# Patient Record
Sex: Female | Born: 1980 | Race: White | Hispanic: No | Marital: Single | Smoking: Current every day smoker
Health system: Southern US, Community
[De-identification: ages and names within clinical notes are randomized; demographics above are authoritative.]

## PROBLEM LIST (undated history)

## (undated) DIAGNOSIS — F419 Anxiety disorder, unspecified: Secondary | ICD-10-CM

## (undated) DIAGNOSIS — G43909 Migraine, unspecified, not intractable, without status migrainosus: Secondary | ICD-10-CM

## (undated) DIAGNOSIS — N809 Endometriosis, unspecified: Secondary | ICD-10-CM

## (undated) DIAGNOSIS — F329 Major depressive disorder, single episode, unspecified: Secondary | ICD-10-CM

## (undated) DIAGNOSIS — F4310 Post-traumatic stress disorder, unspecified: Secondary | ICD-10-CM

## (undated) DIAGNOSIS — F32A Depression, unspecified: Secondary | ICD-10-CM

## (undated) HISTORY — PX: CHOLECYSTECTOMY: SHX55

## (undated) HISTORY — PX: LEEP: SHX91

## (undated) HISTORY — PX: TUBAL LIGATION: SHX77

---

## 2004-01-28 ENCOUNTER — Emergency Department: Payer: Self-pay | Admitting: Emergency Medicine

## 2004-03-10 ENCOUNTER — Emergency Department: Payer: Self-pay | Admitting: Emergency Medicine

## 2004-05-18 ENCOUNTER — Emergency Department: Payer: Self-pay | Admitting: Emergency Medicine

## 2004-06-16 ENCOUNTER — Inpatient Hospital Stay: Payer: Self-pay | Admitting: Surgery

## 2004-08-15 ENCOUNTER — Emergency Department: Payer: Self-pay | Admitting: General Practice

## 2004-11-17 ENCOUNTER — Emergency Department: Payer: Self-pay | Admitting: Emergency Medicine

## 2004-11-17 ENCOUNTER — Other Ambulatory Visit: Payer: Self-pay

## 2005-01-22 ENCOUNTER — Emergency Department: Payer: Self-pay | Admitting: Emergency Medicine

## 2005-03-16 ENCOUNTER — Ambulatory Visit: Payer: Self-pay | Admitting: Unknown Physician Specialty

## 2005-05-06 ENCOUNTER — Emergency Department: Payer: Self-pay | Admitting: Emergency Medicine

## 2005-07-04 ENCOUNTER — Emergency Department: Payer: Self-pay | Admitting: Emergency Medicine

## 2005-10-14 ENCOUNTER — Emergency Department: Payer: Self-pay | Admitting: Emergency Medicine

## 2005-12-06 ENCOUNTER — Emergency Department: Payer: Self-pay | Admitting: Emergency Medicine

## 2006-01-08 ENCOUNTER — Emergency Department: Payer: Self-pay | Admitting: Emergency Medicine

## 2006-05-25 ENCOUNTER — Emergency Department: Payer: Self-pay | Admitting: Emergency Medicine

## 2006-05-26 ENCOUNTER — Ambulatory Visit: Payer: Self-pay | Admitting: Emergency Medicine

## 2006-05-27 ENCOUNTER — Ambulatory Visit: Payer: Self-pay

## 2006-06-05 ENCOUNTER — Emergency Department: Payer: Self-pay | Admitting: Internal Medicine

## 2006-09-06 ENCOUNTER — Ambulatory Visit: Payer: Self-pay

## 2007-02-07 ENCOUNTER — Emergency Department: Payer: Self-pay | Admitting: Emergency Medicine

## 2007-02-07 ENCOUNTER — Other Ambulatory Visit: Payer: Self-pay

## 2007-03-11 ENCOUNTER — Emergency Department: Payer: Self-pay | Admitting: Emergency Medicine

## 2007-04-01 ENCOUNTER — Emergency Department: Payer: Self-pay | Admitting: Emergency Medicine

## 2007-09-03 ENCOUNTER — Observation Stay: Payer: Self-pay | Admitting: Obstetrics and Gynecology

## 2007-09-04 ENCOUNTER — Ambulatory Visit: Payer: Self-pay | Admitting: Obstetrics and Gynecology

## 2007-10-02 ENCOUNTER — Observation Stay: Payer: Self-pay | Admitting: Certified Nurse Midwife

## 2007-11-01 ENCOUNTER — Inpatient Hospital Stay: Payer: Self-pay | Admitting: Obstetrics and Gynecology

## 2007-11-09 ENCOUNTER — Emergency Department: Payer: Self-pay | Admitting: Emergency Medicine

## 2008-02-19 ENCOUNTER — Emergency Department: Payer: Self-pay | Admitting: Emergency Medicine

## 2008-02-25 ENCOUNTER — Emergency Department: Payer: Self-pay | Admitting: Emergency Medicine

## 2008-03-15 ENCOUNTER — Emergency Department (HOSPITAL_COMMUNITY): Admission: EM | Admit: 2008-03-15 | Discharge: 2008-03-16 | Payer: Self-pay | Admitting: Emergency Medicine

## 2008-03-15 ENCOUNTER — Emergency Department: Payer: Self-pay | Admitting: Emergency Medicine

## 2008-04-18 ENCOUNTER — Inpatient Hospital Stay: Payer: Self-pay | Admitting: Unknown Physician Specialty

## 2008-08-07 ENCOUNTER — Emergency Department: Payer: Self-pay | Admitting: Internal Medicine

## 2008-11-26 ENCOUNTER — Emergency Department: Payer: Self-pay | Admitting: Emergency Medicine

## 2008-12-03 ENCOUNTER — Emergency Department: Payer: Self-pay | Admitting: Unknown Physician Specialty

## 2009-05-02 IMAGING — US UNKNOWN
1 series · 14 of 16 positions shown · non-contrast
Comparison: None

CLINICAL DATA: Lower abdominal pain, vaginal discharge.

TRANSABDOMINAL AND TRANSVAGINAL ULTRASOUND OF PELVIS
TECHNIQUE: Both transabdominal and transvaginal ultrasound
examinations of the pelvis were performed including evaluation of
the uterus, ovaries, adnexal regions, and pelvic cul-de-sac.

[Series 1: unknown · 0.32mm/px · 14 of 37 slices shown]
[im 1/37]
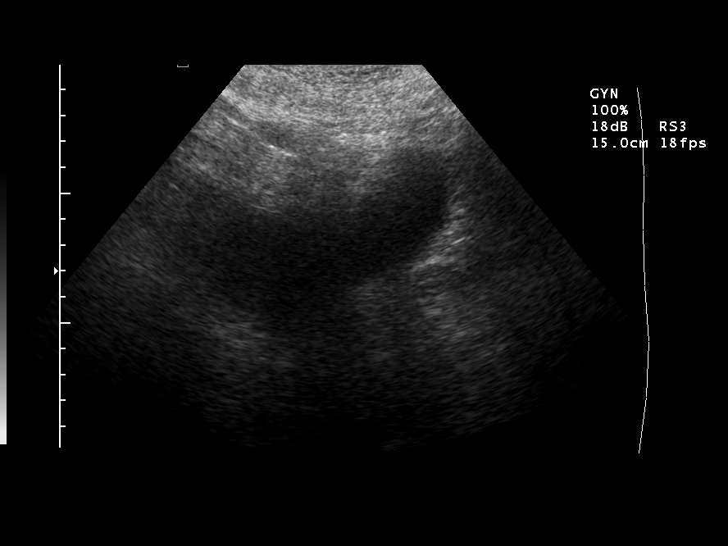
[im 3/37]
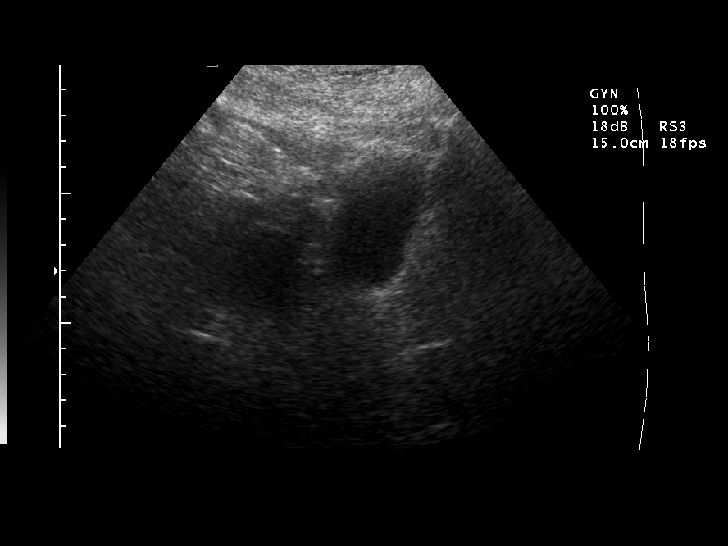
[im 5/37]
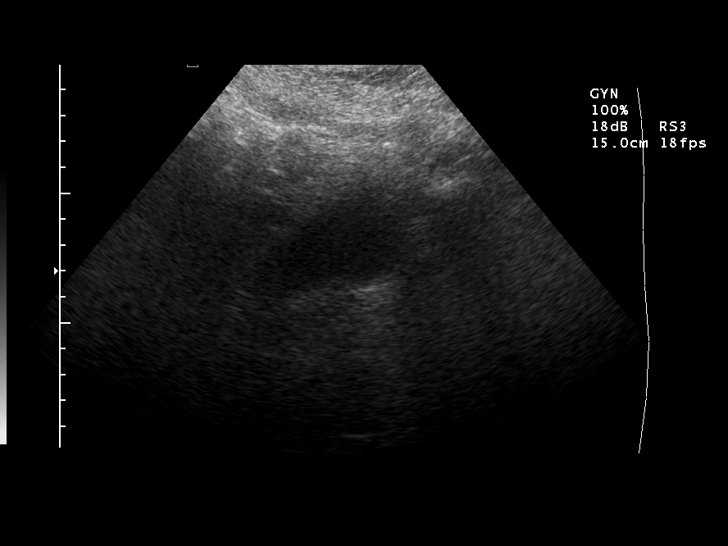
[im 10/37]
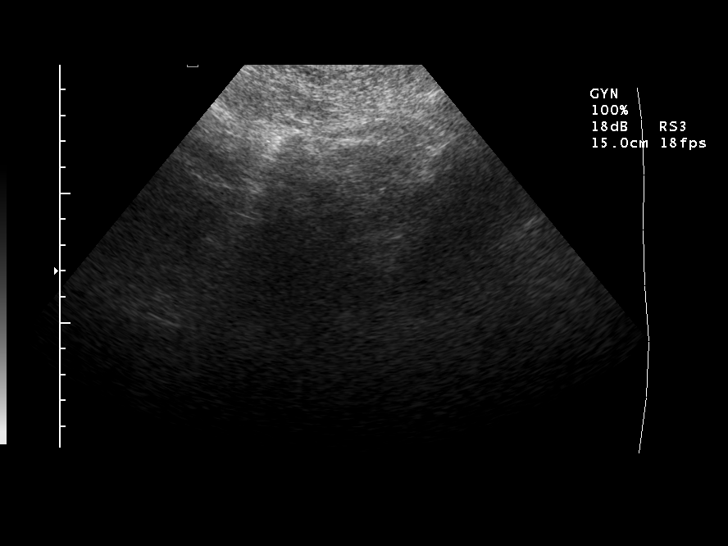
[im 13/37]
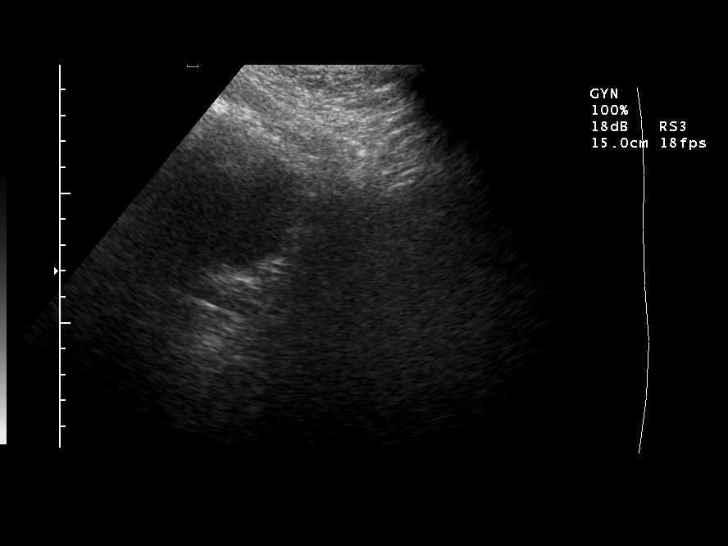
[im 15/37]
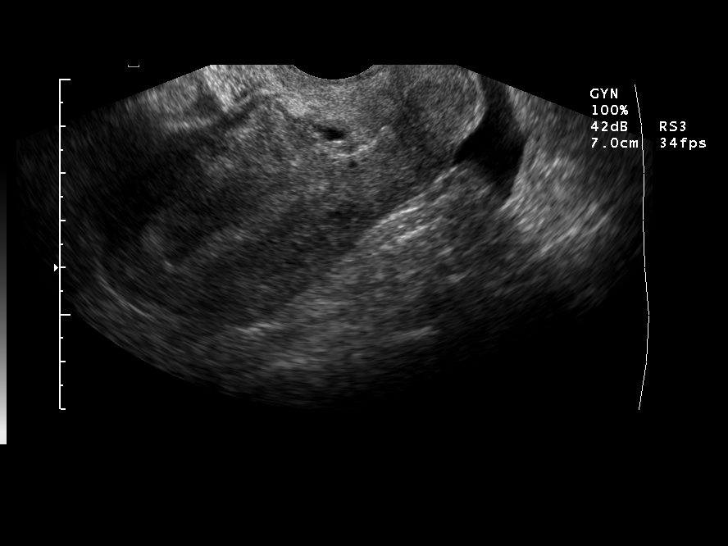
[im 17/37]
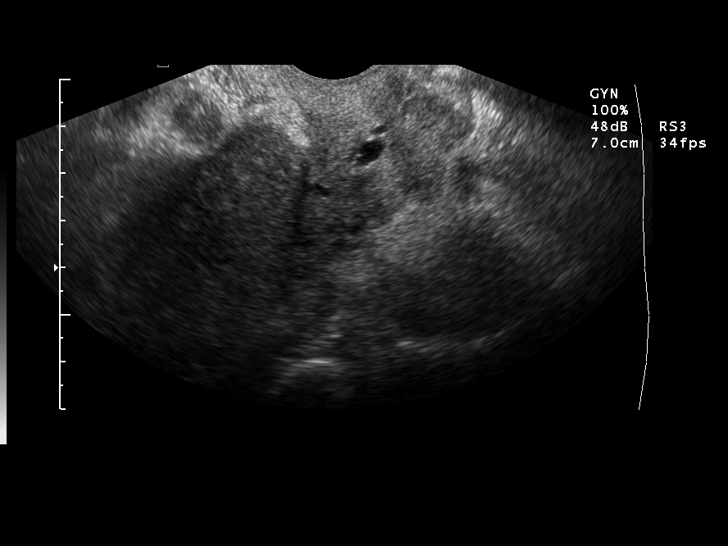
[im 20/37]
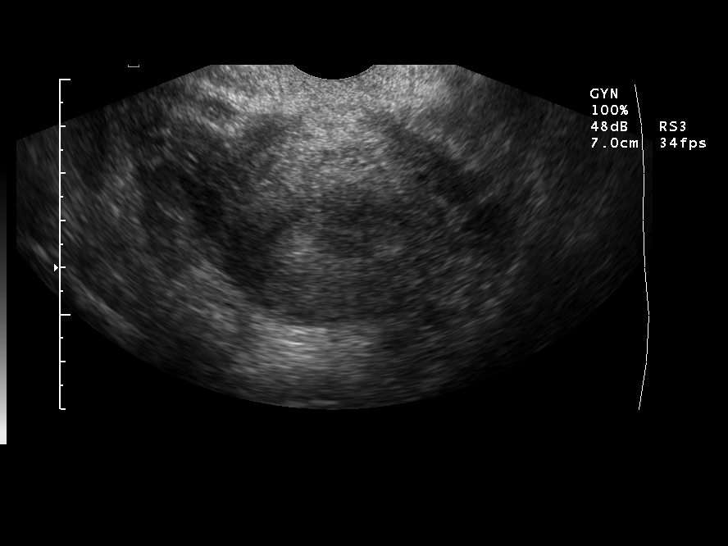
[im 22/37]
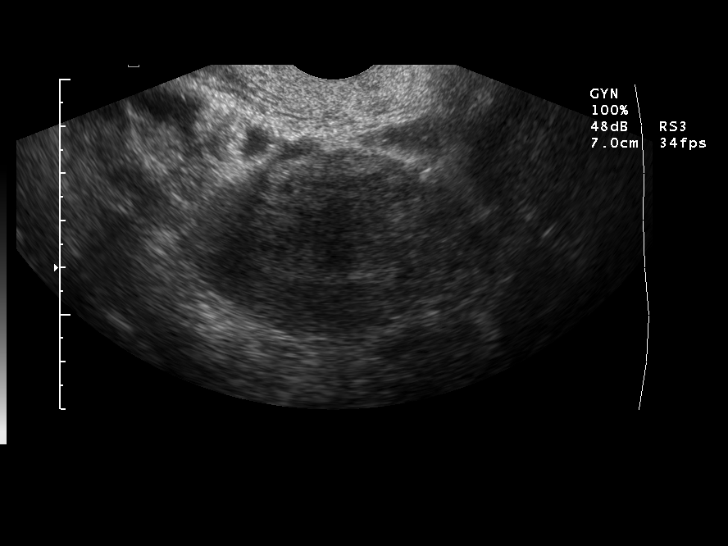
[im 25/37]
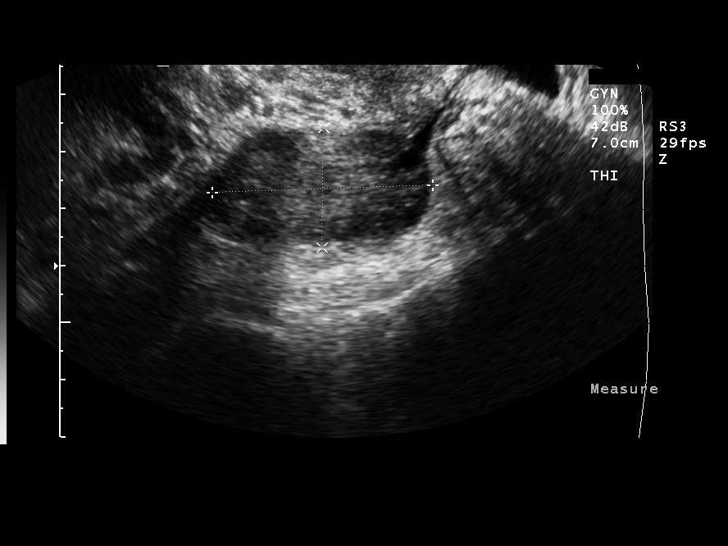
[im 29/37]
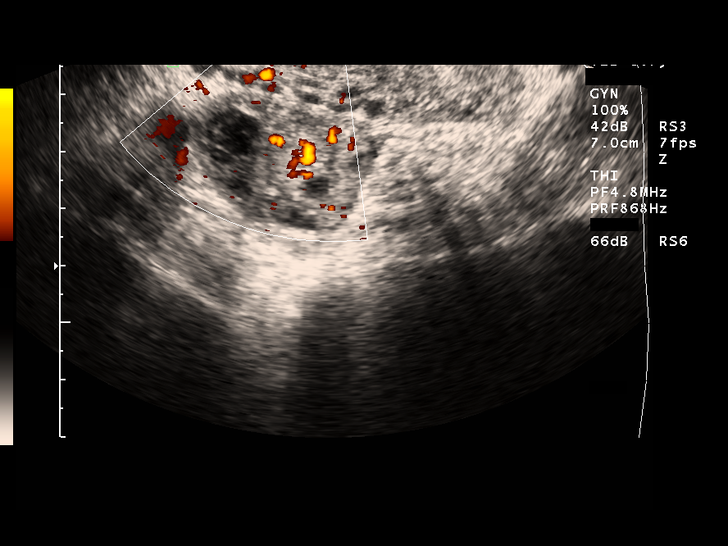
[im 32/37]
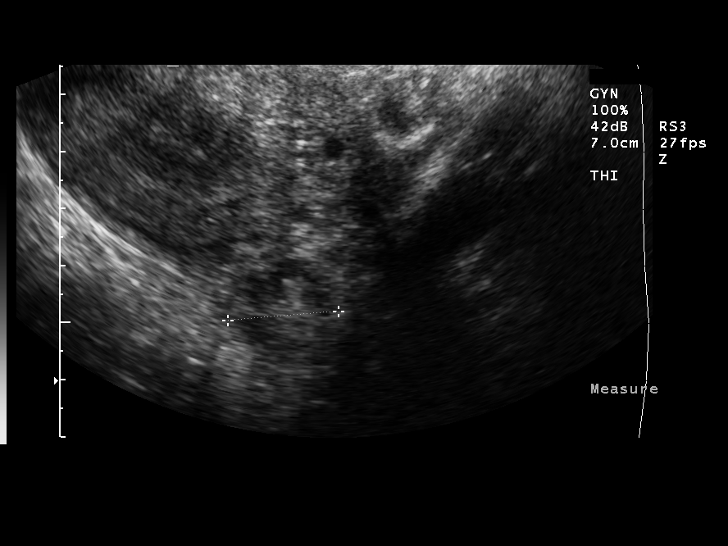
[im 34/37]
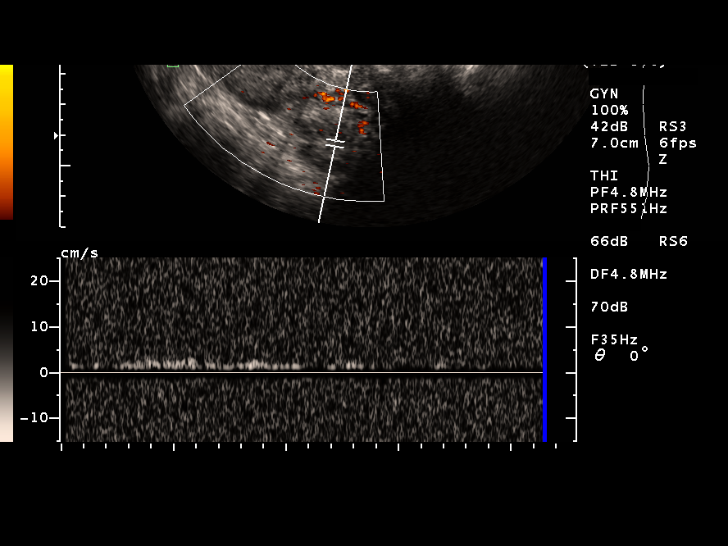
[im 37/37]
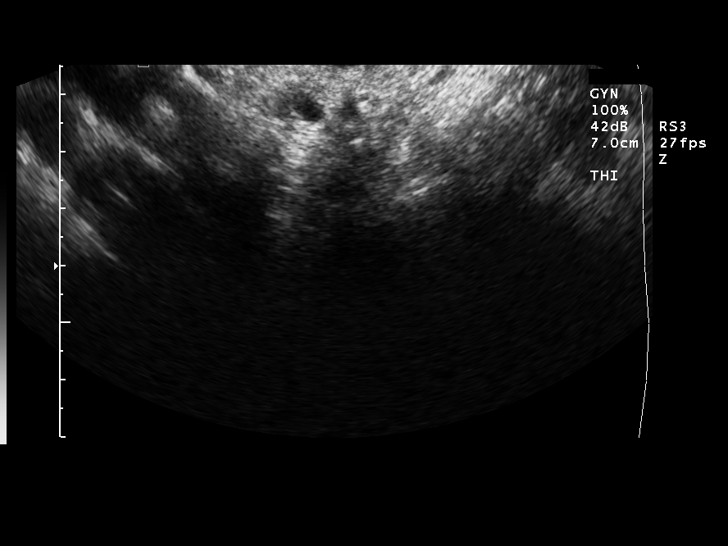

[14 of 16 positions shown; findings below may reference images not displayed]

FINDINGS: Endometrium is prominent, measuring 17 mm.  Uterus
measures 6.0 x 4.3 by 8.8 cm.  No focal uterine lesion.

Ovaries are symmetric in size and echotexture.  Irregular cystic
structure in the right ovary, measuring up to 2.1 cm noted, likely
collapsing cyst.  Left ovary unremarkable.  Small amount of free
fluid in the pelvis.
IMPRESSION: Prominent endometrium, 17 mm.  This may be related to menstrual
phase.  This can be followed with repeat ultrasound in 6-8 weeks.

Elongated, irregular shaped cystic structure in the right ovary,
likely small collapsing cyst.

Small amount of free fluid.

## 2009-07-15 ENCOUNTER — Emergency Department: Payer: Self-pay | Admitting: Emergency Medicine

## 2009-09-09 ENCOUNTER — Emergency Department: Payer: Self-pay | Admitting: Unknown Physician Specialty

## 2009-09-12 ENCOUNTER — Emergency Department: Payer: Self-pay | Admitting: Internal Medicine

## 2009-10-15 ENCOUNTER — Emergency Department: Payer: Self-pay | Admitting: Emergency Medicine

## 2010-01-18 ENCOUNTER — Emergency Department: Payer: Self-pay | Admitting: Emergency Medicine

## 2010-03-17 ENCOUNTER — Emergency Department: Payer: Self-pay | Admitting: Emergency Medicine

## 2010-09-15 ENCOUNTER — Emergency Department: Payer: Self-pay | Admitting: Emergency Medicine

## 2011-01-15 LAB — URINALYSIS, ROUTINE W REFLEX MICROSCOPIC
Hgb urine dipstick: NEGATIVE
Ketones, ur: NEGATIVE mg/dL
Nitrite: POSITIVE — AB
Protein, ur: NEGATIVE mg/dL
Specific Gravity, Urine: 1.016 (ref 1.005–1.030)
Urobilinogen, UA: 1 mg/dL (ref 0.0–1.0)

## 2011-01-15 LAB — CBC
HCT: 41 % (ref 36.0–46.0)
Hemoglobin: 13.8 g/dL (ref 12.0–15.0)
MCV: 92.5 fL (ref 78.0–100.0)
RBC: 4.44 MIL/uL (ref 3.87–5.11)
WBC: 16 10*3/uL — ABNORMAL HIGH (ref 4.0–10.5)

## 2011-01-15 LAB — DIFFERENTIAL
Eosinophils Absolute: 0.2 10*3/uL (ref 0.0–0.7)
Eosinophils Relative: 1 % (ref 0–5)
Lymphocytes Relative: 25 % (ref 12–46)
Lymphs Abs: 4 10*3/uL (ref 0.7–4.0)
Monocytes Relative: 6 % (ref 3–12)

## 2011-01-15 LAB — POCT I-STAT, CHEM 8
BUN: 3 mg/dL — ABNORMAL LOW (ref 6–23)
Creatinine, Ser: 0.8 mg/dL (ref 0.4–1.2)
Hemoglobin: 14.3 g/dL (ref 12.0–15.0)
Potassium: 3.2 mEq/L — ABNORMAL LOW (ref 3.5–5.1)
Sodium: 143 mEq/L (ref 135–145)
TCO2: 27 mmol/L (ref 0–100)

## 2011-01-15 LAB — WET PREP, GENITAL
WBC, Wet Prep HPF POC: NONE SEEN
Yeast Wet Prep HPF POC: NONE SEEN

## 2011-01-15 LAB — POCT PREGNANCY, URINE: Preg Test, Ur: NEGATIVE

## 2011-01-15 LAB — GC/CHLAMYDIA PROBE AMP, GENITAL
Chlamydia, DNA Probe: NEGATIVE
GC Probe Amp, Genital: NEGATIVE

## 2011-03-18 ENCOUNTER — Emergency Department: Payer: Self-pay | Admitting: Emergency Medicine

## 2011-08-09 ENCOUNTER — Emergency Department: Payer: Self-pay | Admitting: *Deleted

## 2012-09-30 ENCOUNTER — Emergency Department: Payer: Self-pay | Admitting: Emergency Medicine

## 2012-10-12 ENCOUNTER — Emergency Department: Payer: Self-pay | Admitting: Emergency Medicine

## 2012-11-07 ENCOUNTER — Emergency Department: Payer: Self-pay | Admitting: Internal Medicine

## 2012-11-25 ENCOUNTER — Emergency Department: Payer: Self-pay | Admitting: Emergency Medicine

## 2012-12-06 ENCOUNTER — Emergency Department: Payer: Self-pay | Admitting: Emergency Medicine

## 2013-01-07 ENCOUNTER — Ambulatory Visit: Payer: Self-pay | Admitting: Family Medicine

## 2013-02-01 ENCOUNTER — Encounter (HOSPITAL_COMMUNITY): Payer: Self-pay | Admitting: Emergency Medicine

## 2013-02-01 ENCOUNTER — Emergency Department (HOSPITAL_COMMUNITY)
Admission: EM | Admit: 2013-02-01 | Discharge: 2013-02-01 | Disposition: A | Discharge status: Home / Self Care | Payer: Medicaid Other | Attending: Emergency Medicine | Admitting: Emergency Medicine

## 2013-02-01 DIAGNOSIS — F3289 Other specified depressive episodes: Secondary | ICD-10-CM | POA: Insufficient documentation

## 2013-02-01 DIAGNOSIS — L02419 Cutaneous abscess of limb, unspecified: Secondary | ICD-10-CM | POA: Insufficient documentation

## 2013-02-01 DIAGNOSIS — R197 Diarrhea, unspecified: Secondary | ICD-10-CM | POA: Insufficient documentation

## 2013-02-01 DIAGNOSIS — L0291 Cutaneous abscess, unspecified: Secondary | ICD-10-CM

## 2013-02-01 DIAGNOSIS — F329 Major depressive disorder, single episode, unspecified: Secondary | ICD-10-CM | POA: Insufficient documentation

## 2013-02-01 HISTORY — DX: Major depressive disorder, single episode, unspecified: F32.9

## 2013-02-01 HISTORY — DX: Depression, unspecified: F32.A

## 2013-02-01 MED ORDER — TRAMADOL HCL 50 MG PO TABS: 50.0000 mg | ORAL_TABLET | Freq: Four times a day (QID) | ORAL | Status: DC | PRN

## 2013-02-01 MED ORDER — OXYCODONE-ACETAMINOPHEN 5-325 MG PO TABS: 1.0000 | ORAL_TABLET | Freq: Three times a day (TID) | ORAL | Status: DC | PRN

## 2013-02-01 MED ORDER — CLINDAMYCIN HCL 150 MG PO CAPS: 450.0000 mg | ORAL_CAPSULE | Freq: Three times a day (TID) | ORAL | Status: DC

## 2013-02-01 NOTE — ED Provider Notes (Signed)
CSN: [id number]     Arrival date & time 02/01/13  1005 History   This chart was scribed for non-physician practitioner Raymon Mutton, PA-C working with [name], DO by Valera Castle, ED scribe. This patient was seen in room TR10C/TR10C and the patient's care was started at 11:49 AM.    Chief Complaint  Patient presents with  . Wound Infection    The history is provided by the patient. No language interpreter was used.   HPI Comments: Cassandra Pena is a 32 y.o. female who presents to the Emergency Department complaining of 2 sudden, moderate, abscesses, without drainage, onset 3 AM this morning when she got up to use the bathroom and noticed pain when sitting down. She states the areas are tender to the touch, but that she can feel them even without touching. She denies trying to pop the bumps. She reports associated diarrhea. She states she has a h/o abscesses. She denies trying hot compresses and being on any antibiotics. She states she has been on Venlafaxine XR. She denies fever, chills, numbness, tingling, SOB, chest pain, difficulty breathing, abdominal pain, current dysuria, vaginal discharge, neck pain, neck stiffness, and any other associated symptoms.  PCP - Pcp Not In [facility]   Past Medical History  Diagnosis Date  . Depression    No past surgical history on file. No family history on file. History  Substance Use Topics  . Smoking status: Not on file  . Smokeless tobacco: Not on file  . Alcohol Use: Not on file   OB History   Grav Para Term Preterm Abortions TAB SAB Ect Mult Living                 Review of Systems  Constitutional: Negative for fever and chills.  Respiratory: Negative for shortness of breath.   Cardiovascular: Negative for chest pain.  Gastrointestinal: Positive for diarrhea. Negative for abdominal pain.  Genitourinary: Negative for dysuria and vaginal discharge.  Musculoskeletal: Negative for neck pain and neck  stiffness.  Skin:       bumps, with redness on left upper thigh  Neurological: Negative for numbness.  All other systems reviewed and are negative.    Allergies  Vicodin  Home Medications   Current Outpatient Rx  Name  Route  Sig  Dispense  Refill  . venlafaxine XR (EFFEXOR-XR) 150 MG 24 hr capsule   Oral   Take 150 mg by mouth daily.         . clindamycin (CLEOCIN) 150 MG capsule   Oral   Take 3 capsules (450 mg total) by mouth 3 (three) times daily.   90 capsule   0   . traMADol (ULTRAM) 50 MG tablet   Oral   Take 1 tablet (50 mg total) by mouth every 6 (six) hours as needed for pain.   11 tablet   0     Triage Vitals: BP 134/74  Pulse 81  Temp(Src) 97.9 F (36.6 C) (Oral)  Wt 196 lb 1.6 oz (88.95 kg)  SpO2 98%  Physical Exam  Nursing note and vitals reviewed. Constitutional: She is oriented to person, place, and time. She appears well-developed and well-nourished. No distress.  HENT:  Head: Normocephalic and atraumatic.  Neck: Neck supple. No tracheal deviation present.  Cardiovascular:  Pulses:      Dorsalis pedis pulses are 2+ on the right side, and 2+ on the left side.  Musculoskeletal: Normal range of motion.  Neurological: She is alert and  oriented to person, place, and time. She exhibits normal muscle tone. Coordination normal.  Strength intact to the lower extremities bilaterally with resistance applied, equal distribution.   Skin: Skin is warm and dry. There is erythema.  Approximately 3 cm x 3 cm fluctuant abscess with surrounding erythema localized to the medial proximal aspect of the left thigh. Approximately 1 cm x 1 cm fluctuant abscess to the medial aspect of the proximal third of the left thigh with surrounding erythema. Discomfort upon palpation.  Psychiatric: She has a normal mood and affect. Her behavior is normal.    ED Course  Procedures (including critical care time)  DIAGNOSTIC STUDIES: Oxygen Saturation is 98% on room air,  normal by my interpretation.    COORDINATION OF CARE: 11:54 AM-Discussed treatment plan which includes I&D with pt at bedside and pt agreed to plan.   INCISION AND DRAINAGE Performed by: Ardis Hughs, PA-student supervised by Raymon Mutton Consent: Verbal consent obtained. Risks and benefits: risks, benefits and alternatives were discussed Type: abscess Body area: inner aspect of left upper thigh x 2 Anesthesia: local infiltration Incision was made with a scalpel. Local anesthetic: lidocaine 2% without epinephrine Anesthetic total: 5 ml Complexity: complex Blunt dissection to break up loculations Drainage: blood Drainage amount: 3-5 cc Patient tolerance: Patient tolerated the procedure well with no immediate complications.     Labs Review Labs Reviewed - No data to display Imaging Review No results found.  EKG Interpretation   None      Meds ordered this encounter  Medications  . venlafaxine XR (EFFEXOR-XR) 150 MG 24 hr capsule    Sig: Take 150 mg by mouth daily.  . clindamycin (CLEOCIN) 150 MG capsule    Sig: Take 3 capsules (450 mg total) by mouth 3 (three) times daily.    Dispense:  90 capsule    Refill:  0    Order Specific Question:  Supervising Provider    Answer:  Eber Hong D [[id number]]  . traMADol (ULTRAM) 50 MG tablet    Sig: Take 1 tablet (50 mg total) by mouth every 6 (six) hours as needed for pain.    Dispense:  11 tablet    Refill:  0    Order Specific Question:  Supervising Provider    Answer:  Eber Hong D [[id number]]    MDM   1. Abscess    I personally performed the services described in this documentation, which was scribed in my presence. The recorded information has been reviewed and is accurate.  Patient presenting to emergency department with 2 abscesses localized to the inner aspect of her left thigh, fluctuant - one measuring approximately 1 cmby 1 cm and the other measuring approximately 3 cm x 3 cm. Induration identified,  surrounding erythema identified. Negative streaking, negative active drainage identified. I&D performed-mainly blood. Site clean thoroughly. Fresh gauze applied. Patient stable, afebrile. Patient discharged. Discharged patient with antibiotics and pain medications-discussed course, precautions, disposal technique. Discussed with patient proper wound care. Referred patient to general surgery. Discussed with patient to closely monitor symptoms and if symptoms are to worsen or change report back to emergency department - strict return instructions given. Patient agreed to plan of care, understood, all questions answered.   Raymon Mutton, PA-C 02/02/13 [phone]

## 2013-02-01 NOTE — ED Notes (Signed)
Has 2 bumps on left upper inner thigh that are red and   Hurting pt states that she has had them before just nioticed these yesterday

## 2013-02-02 NOTE — ED Provider Notes (Signed)
Medical screening examination/treatment/procedure(s) were performed by non-physician practitioner and as supervising physician I was immediately available for consultation/collaboration.  EKG Interpretation   None         Layla Maw Jarrett Albor, DO 02/02/13 2304

## 2013-08-03 ENCOUNTER — Ambulatory Visit: Payer: Self-pay | Admitting: Physician Assistant

## 2013-08-07 LAB — WOUND CULTURE

## 2014-02-25 ENCOUNTER — Emergency Department: Payer: Self-pay | Admitting: Emergency Medicine

## 2014-02-25 LAB — TSH: THYROID STIMULATING HORM: 1.3 u[IU]/mL

## 2014-02-25 LAB — CBC
HCT: 41.2 % (ref 35.0–47.0)
HGB: 13.7 g/dL (ref 12.0–16.0)
MCH: 30.8 pg (ref 26.0–34.0)
MCHC: 33.4 g/dL (ref 32.0–36.0)
MCV: 92 fL (ref 80–100)
PLATELETS: 287 10*3/uL (ref 150–440)
RBC: 4.47 10*6/uL (ref 3.80–5.20)
RDW: 12.7 % (ref 11.5–14.5)
WBC: 16.1 10*3/uL — ABNORMAL HIGH (ref 3.6–11.0)

## 2014-02-25 LAB — COMPREHENSIVE METABOLIC PANEL
ALK PHOS: 92 U/L
AST: 9 U/L — AB (ref 15–37)
Albumin: 3.8 g/dL (ref 3.4–5.0)
Anion Gap: 3 — ABNORMAL LOW (ref 7–16)
BILIRUBIN TOTAL: 0.1 mg/dL — AB (ref 0.2–1.0)
BUN: 8 mg/dL (ref 7–18)
CALCIUM: 9 mg/dL (ref 8.5–10.1)
CHLORIDE: 106 mmol/L (ref 98–107)
CREATININE: 0.97 mg/dL (ref 0.60–1.30)
Co2: 32 mmol/L (ref 21–32)
Glucose: 118 mg/dL — ABNORMAL HIGH (ref 65–99)
Osmolality: 281 (ref 275–301)
Potassium: 3.7 mmol/L (ref 3.5–5.1)
SGPT (ALT): 20 U/L
SODIUM: 141 mmol/L (ref 136–145)
Total Protein: 6.7 g/dL (ref 6.4–8.2)

## 2014-02-25 LAB — DRUG SCREEN, URINE
Amphetamines, Ur Screen: NEGATIVE (ref ?–1000)
BENZODIAZEPINE, UR SCRN: POSITIVE (ref ?–200)
Barbiturates, Ur Screen: POSITIVE (ref ?–200)
COCAINE METABOLITE, UR ~~LOC~~: POSITIVE (ref ?–300)
Cannabinoid 50 Ng, Ur ~~LOC~~: POSITIVE (ref ?–50)
MDMA (Ecstasy)Ur Screen: NEGATIVE (ref ?–500)
METHADONE, UR SCREEN: NEGATIVE (ref ?–300)
Opiate, Ur Screen: NEGATIVE (ref ?–300)
PHENCYCLIDINE (PCP) UR S: NEGATIVE (ref ?–25)
Tricyclic, Ur Screen: NEGATIVE (ref ?–1000)

## 2014-02-25 LAB — ACETAMINOPHEN LEVEL: Acetaminophen: 6 ug/mL — ABNORMAL LOW

## 2014-02-25 LAB — ETHANOL

## 2014-02-25 LAB — SALICYLATE LEVEL: SALICYLATES, SERUM: 2.5 mg/dL

## 2014-08-29 ENCOUNTER — Emergency Department
Admission: EM | Admit: 2014-08-29 | Discharge: 2014-08-29 | Disposition: A | Discharge status: Home / Self Care | Payer: Medicaid Other | Attending: Emergency Medicine | Admitting: Emergency Medicine

## 2014-08-29 ENCOUNTER — Encounter: Payer: Self-pay | Admitting: Emergency Medicine

## 2014-08-29 DIAGNOSIS — Z720 Tobacco use: Secondary | ICD-10-CM | POA: Insufficient documentation

## 2014-08-29 DIAGNOSIS — Z79899 Other long term (current) drug therapy: Secondary | ICD-10-CM | POA: Insufficient documentation

## 2014-08-29 DIAGNOSIS — F32A Depression, unspecified: Secondary | ICD-10-CM

## 2014-08-29 DIAGNOSIS — F329 Major depressive disorder, single episode, unspecified: Secondary | ICD-10-CM

## 2014-08-29 DIAGNOSIS — F4310 Post-traumatic stress disorder, unspecified: Secondary | ICD-10-CM | POA: Insufficient documentation

## 2014-08-29 DIAGNOSIS — Z792 Long term (current) use of antibiotics: Secondary | ICD-10-CM | POA: Insufficient documentation

## 2014-08-29 HISTORY — DX: Post-traumatic stress disorder, unspecified: F43.10

## 2014-08-29 HISTORY — DX: Anxiety disorder, unspecified: F41.9

## 2014-08-29 HISTORY — DX: Endometriosis, unspecified: N80.9

## 2014-08-29 LAB — URINE DRUG SCREEN, QUALITATIVE (ARMC ONLY)
AMPHETAMINES, UR SCREEN: NOT DETECTED
Barbiturates, Ur Screen: NOT DETECTED
Benzodiazepine, Ur Scrn: POSITIVE — AB
Cannabinoid 50 Ng, Ur ~~LOC~~: POSITIVE — AB
Cocaine Metabolite,Ur ~~LOC~~: NOT DETECTED
MDMA (ECSTASY) UR SCREEN: NOT DETECTED
METHADONE SCREEN, URINE: NOT DETECTED
Opiate, Ur Screen: POSITIVE — AB
PHENCYCLIDINE (PCP) UR S: NOT DETECTED
Tricyclic, Ur Screen: NOT DETECTED

## 2014-08-29 LAB — CBC WITH DIFFERENTIAL/PLATELET
BASOS PCT: 0 %
Basophils Absolute: 0 10*3/uL (ref 0–0.1)
EOS ABS: 0.2 10*3/uL (ref 0–0.7)
Eosinophils Relative: 2 %
HCT: 43.7 % (ref 35.0–47.0)
HEMOGLOBIN: 14.2 g/dL (ref 12.0–16.0)
LYMPHS ABS: 4.8 10*3/uL — AB (ref 1.0–3.6)
Lymphocytes Relative: 40 %
MCH: 29.2 pg (ref 26.0–34.0)
MCHC: 32.5 g/dL (ref 32.0–36.0)
MCV: 90.1 fL (ref 80.0–100.0)
MONOS PCT: 7 %
Monocytes Absolute: 0.9 10*3/uL (ref 0.2–0.9)
NEUTROS ABS: 6.1 10*3/uL (ref 1.4–6.5)
NEUTROS PCT: 51 %
PLATELETS: 292 10*3/uL (ref 150–440)
RBC: 4.85 MIL/uL (ref 3.80–5.20)
RDW: 13.4 % (ref 11.5–14.5)
WBC: 12.1 10*3/uL — ABNORMAL HIGH (ref 3.6–11.0)

## 2014-08-29 LAB — COMPREHENSIVE METABOLIC PANEL
ALBUMIN: 4.1 g/dL (ref 3.5–5.0)
ALK PHOS: 68 U/L (ref 38–126)
ALT: 18 U/L (ref 14–54)
AST: 20 U/L (ref 15–41)
Anion gap: 9 (ref 5–15)
BUN: 5 mg/dL — ABNORMAL LOW (ref 6–20)
CO2: 28 mmol/L (ref 22–32)
Calcium: 9.1 mg/dL (ref 8.9–10.3)
Chloride: 104 mmol/L (ref 101–111)
Creatinine, Ser: 0.66 mg/dL (ref 0.44–1.00)
GFR calc non Af Amer: >60 mL/min (ref >60)
GLUCOSE: 93 mg/dL (ref 65–99)
POTASSIUM: 3.6 mmol/L (ref 3.5–5.1)
SODIUM: 141 mmol/L (ref 135–145)
TOTAL PROTEIN: 7 g/dL (ref 6.5–8.1)
Total Bilirubin: 0.2 mg/dL — ABNORMAL LOW (ref 0.3–1.2)

## 2014-08-29 LAB — ETHANOL: Alcohol, Ethyl (B): <5 mg/dL (ref <5)

## 2014-08-29 LAB — POCT PREGNANCY, URINE: Preg Test, Ur: NEGATIVE

## 2014-08-29 MED ORDER — LORAZEPAM 1 MG PO TABS
1.0000 mg | ORAL_TABLET | Freq: Once | ORAL | Status: AC
  Administered 2014-08-29: 1 mg via ORAL

## 2014-08-29 MED ORDER — LORAZEPAM 1 MG PO TABS
ORAL_TABLET | ORAL | Status: AC
  Administered 2014-08-29: 1 mg via ORAL
  Filled 2014-08-29: qty 1

## 2014-08-29 NOTE — ED Notes (Signed)
[facility] intake Cassandra Pena speaking with pt at this time.

## 2014-08-29 NOTE — Discharge Instructions (Signed)
Depression °Depression refers to feeling sad, low, down in the dumps, blue, gloomy, or empty. In general, there are two kinds of depression: °1. Normal sadness or normal grief. This kind of depression is one that we all feel from time to time after upsetting life experiences, such as the loss of a job or the ending of a relationship. This kind of depression is considered normal, is short lived, and resolves within a few days to 2 weeks. Depression experienced after the loss of a loved one (bereavement) often lasts longer than 2 weeks but normally gets better with time. °2. Clinical depression. This kind of depression lasts longer than normal sadness or normal grief or interferes with your ability to function at home, at work, and in school. It also interferes with your personal relationships. It affects almost every aspect of your life. Clinical depression is an illness. °Symptoms of depression can also be caused by conditions other than those mentioned above, such as: °· Physical illness. Some physical illnesses, including underactive thyroid gland (hypothyroidism), severe anemia, specific types of cancer, diabetes, uncontrolled seizures, heart and lung problems, strokes, and chronic pain are commonly associated with symptoms of depression. °· Side effects of some prescription medicine. In some people, certain types of medicine can cause symptoms of depression. °· Substance abuse. Abuse of alcohol and illicit drugs can cause symptoms of depression. °SYMPTOMS °Symptoms of normal sadness and normal grief include the following: °· Feeling sad or crying for short periods of time. °· Not caring about anything (apathy). °· Difficulty sleeping or sleeping too much. °· No longer able to enjoy the things you used to enjoy. °· Desire to be by oneself all the time (social isolation). °· Lack of energy or motivation. °· Difficulty concentrating or remembering. °· Change in appetite or weight. °· Restlessness or  agitation. °Symptoms of clinical depression include the same symptoms of normal sadness or normal grief and also the following symptoms: °· Feeling sad or crying all the time. °· Feelings of guilt or worthlessness. °· Feelings of hopelessness or helplessness. °· Thoughts of suicide or the desire to harm yourself (suicidal ideation). °· Loss of touch with reality (psychotic symptoms). Seeing or hearing things that are not real (hallucinations) or having false beliefs about your life or the people around you (delusions and paranoia). °DIAGNOSIS  °The diagnosis of clinical depression is usually based on how bad the symptoms are and how long they have lasted. Your health care provider will also ask you questions about your medical history and substance use to find out if physical illness, use of prescription medicine, or substance abuse is causing your depression. Your health care provider may also order blood tests. °TREATMENT  °Often, normal sadness and normal grief do not require treatment. However, sometimes antidepressant medicine is given for bereavement to ease the depressive symptoms until they resolve. °The treatment for clinical depression depends on how bad the symptoms are but often includes antidepressant medicine, counseling with a mental health professional, or both. Your health care provider will help to determine what treatment is best for you. °Depression caused by physical illness usually goes away with appropriate medical treatment of the illness. If prescription medicine is causing depression, talk with your health care provider about stopping the medicine, decreasing the dose, or changing to another medicine. °Depression caused by the abuse of alcohol or illicit drugs goes away when you stop using these substances. Some adults need professional help in order to stop drinking or using drugs. °SEEK IMMEDIATE MEDICAL   CARE IF:  You have thoughts about hurting yourself or others.  You lose touch  with reality (have psychotic symptoms).  You are taking medicine for depression and have a serious side effect. FOR MORE INFORMATION  National Alliance on Mental Illness: www.nami.[employer]  National Institute of Mental Health: http://www.maynard.net/www.nimh.nih.gov Document Released: 03/26/2000 Document Revised: 08/13/2013 Document Reviewed: 06/28/2011 [facility]Exit[facility] Patient Information 2015 [place] [employer], [place]. This information is not intended to replace advice given to you by your health care provider. Make sure you discuss any questions you have with your health care provider.  Please follow up with [facility] as planned and return for any problems.

## 2014-08-29 NOTE — ED Notes (Signed)
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
## 2014-08-29 NOTE — ED Notes (Signed)
Pt moved to 19H Bed Patient assigned to appropriate care area. Patient oriented to unit/care area: Informed that, for their safety, care areas are designed for safety and monitored by security cameras at all times; and visiting hours explained to patient. Patient verbalizes understanding, and verbal contract for safety obtained.

## 2014-08-29 NOTE — ED Notes (Signed)
BEHAVIORAL HEALTH ROUNDING Patient sleeping: No. Patient alert and oriented: yes Behavior appropriate: Yes.   Nutrition and fluids offered: Yes  Toileting and hygiene offered: Yes  Sitter present: q15 min observations Law enforcement present: Yes [employer] 

## 2014-08-29 NOTE — ED Notes (Signed)
Pt presents to ED with c/o depression. Pt states she started a new job about a year ago and had to stop taking her medications and therapy for a hx of depression, PTSD, and anxiety disorder. Pt tearful and states "emotionally I am not stable anymore". Spoke with Child psychotherapistsocial worker and was told to come to this ED for a month worth of Prozac, which she was taking previously. Pt states DSS got call to her home with concerns about her children due to her being "so emotional". Denies SI.

## 2014-08-29 NOTE — ED Notes (Signed)
Discussed with pt the process for [facility] intake--pt will be speaking with Claris CheMargaret. Discussed with pt for verbal safety contract while she is in the ER. Pt tearful laying on stretcher. Pt verbalized that she cannot stay overnight and see psych MD in am because she has appts tomorrow that she can't miss. Per Dr Darnelle CatalanMalinda pt reported that she has appt is with psychiatry tomorrow. Consult to TTS order is in place
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
## 2014-10-16 ENCOUNTER — Other Ambulatory Visit: Payer: Self-pay | Admitting: Family Medicine

## 2014-10-16 DIAGNOSIS — N6310 Unspecified lump in the right breast, unspecified quadrant: Secondary | ICD-10-CM

## 2014-10-31 ENCOUNTER — Other Ambulatory Visit: Payer: Self-pay | Admitting: Family Medicine

## 2014-10-31 DIAGNOSIS — N6310 Unspecified lump in the right breast, unspecified quadrant: Secondary | ICD-10-CM

## 2014-11-11 ENCOUNTER — Other Ambulatory Visit: Payer: Self-pay | Admitting: Family Medicine

## 2014-11-11 ENCOUNTER — Ambulatory Visit: Payer: Medicaid Other

## 2014-11-11 ENCOUNTER — Ambulatory Visit
Admission: RE | Admit: 2014-11-11 | Discharge: 2014-11-11 | Disposition: A | Discharge status: Home / Self Care | Payer: 59 | Source: Ambulatory Visit | Attending: Family Medicine | Admitting: Family Medicine

## 2014-11-11 DIAGNOSIS — R928 Other abnormal and inconclusive findings on diagnostic imaging of breast: Secondary | ICD-10-CM | POA: Insufficient documentation

## 2014-11-11 DIAGNOSIS — N63 Unspecified lump in breast: Secondary | ICD-10-CM | POA: Diagnosis present

## 2014-11-11 DIAGNOSIS — N6310 Unspecified lump in the right breast, unspecified quadrant: Secondary | ICD-10-CM

## 2017-08-07 ENCOUNTER — Ambulatory Visit
Admission: EM | Admit: 2017-08-07 | Discharge: 2017-08-07 | Disposition: A | Discharge status: Home / Self Care | Payer: 59 | Attending: Family Medicine | Admitting: Family Medicine

## 2017-08-07 ENCOUNTER — Other Ambulatory Visit: Payer: Self-pay

## 2017-08-07 ENCOUNTER — Encounter: Payer: Self-pay | Admitting: Emergency Medicine

## 2017-08-07 DIAGNOSIS — J020 Streptococcal pharyngitis: Secondary | ICD-10-CM

## 2017-08-07 DIAGNOSIS — J029 Acute pharyngitis, unspecified: Secondary | ICD-10-CM

## 2017-08-07 LAB — RAPID STREP SCREEN (MED CTR MEBANE ONLY): STREPTOCOCCUS, GROUP A SCREEN (DIRECT): POSITIVE — AB

## 2017-08-07 MED ORDER — AMOXICILLIN 875 MG PO TABS: 875.0000 mg | ORAL_TABLET | Freq: Two times a day (BID) | ORAL | 0 refills | Status: DC

## 2017-08-07 NOTE — Discharge Instructions (Addendum)
Please alternate Tylenol and ibuprofen as needed for pain.  Take antibiotics as prescribed return to clinic for any difficulty swallowing worsening symptoms or urgent changes in health.

## 2017-08-07 NOTE — ED Triage Notes (Signed)
Patient in today c/o sore throat x 3 days. Patient denies fever. Patient has tried OTC Tylenol and throat lozenges.

## 2017-08-07 NOTE — ED Provider Notes (Signed)
[facility]    CSN: [id number] Arrival date & time: 08/07/17  1450     History   Chief Complaint Chief Complaint  Patient presents with  . Sore Throat    HPI Cassandra Pena is a 37 y.o. female.   Presents to the urgent care facility for evaluation of sore throat x3 days.  Patient has had sore throat with anterior neck pain with minimal cough and no significant congestion x3 days.  She is been taken Tylenol and throat lozenges.  She states her throat is red.  She denies any difficulty swallowing.  No noticeable fevers.  No chest pain, shortness of breath, nausea vomiting.  HPI  Past Medical History:  Diagnosis Date  . Anxiety   . Depression   . Endometriosis   . PTSD (post-traumatic stress disorder)     There are no active problems to display for this patient.   Past Surgical History:  Procedure Laterality Date  . CHOLECYSTECTOMY    . LEEP    . TUBAL LIGATION      OB History   None      Home Medications    Prior to Admission medications   Medication Sig Start Date End Date Taking? Authorizing Provider  fexofenadine-pseudoephedrine (ALLEGRA-D 24) 180-240 MG 24 hr tablet Take 1 tablet by mouth daily.   Yes [provider]  Multiple Vitamins-Minerals (WOMENS MULTIVITAMIN PO) Take 1 tablet by mouth daily.   Yes [provider]  amoxicillin (AMOXIL) 875 MG tablet Take 1 tablet (875 mg total) by mouth 2 (two) times daily. 08/07/17   Evon Slack, PA-C  clindamycin (CLEOCIN) 150 MG capsule Take 3 capsules (450 mg total) by mouth 3 (three) times daily. 02/01/13   Sciacca, Marissa, PA-C  oxyCODONE-acetaminophen (PERCOCET/ROXICET) 5-325 MG per tablet Take 1 tablet by mouth every 8 (eight) hours as needed for pain. 02/01/13   Sciacca, Marissa, PA-C  venlafaxine XR (EFFEXOR-XR) 150 MG 24 hr capsule Take 150 mg by mouth daily.    [provider]    Family History Family History  Problem Relation Age of Onset  . Breast cancer  Paternal Aunt 30  . Breast cancer Paternal Grandmother   . Hypertension Mother   . Other Father        unknown medical history    Social History Social History   Tobacco Use  . Smoking status: Current Every Day Smoker    Packs/day: 1.00    Types: Cigarettes  . Smokeless tobacco: Never Used  Substance Use Topics  . Alcohol use: No  . Drug use: No     Allergies   Vicodin [hydrocodone-acetaminophen]   Review of Systems Review of Systems  Constitutional: Negative for fever.  HENT: Positive for sore throat. Negative for congestion, ear discharge, rhinorrhea, sinus pressure, sinus pain, trouble swallowing and voice change.   Respiratory: Negative for cough, shortness of breath, wheezing and stridor.   Cardiovascular: Negative for chest pain.  Gastrointestinal: Negative for abdominal pain, diarrhea, nausea and vomiting.  Genitourinary: Negative for dysuria, flank pain and pelvic pain.  Musculoskeletal: Negative for back pain and myalgias.  Skin: Negative for rash.  Neurological: Negative for dizziness and headaches.     Physical Exam Triage Vital Signs ED Triage Vitals [08/07/17 1501]  Enc Vitals Group     BP 119/66     Pulse Rate 70     Resp 16     Temp 98.3 F (36.8 C)     Temp Source Oral  SpO2 99 %     Weight 220 lb (99.8 kg)     Height  (1.727 m)     Head Circumference      Peak Flow      Pain Score 5     Pain Loc      Pain Edu?      Excl. in GC?    No data found.  Updated Vital Signs BP 119/66 (BP Location: Left Arm)   Pulse 70   Temp 98.3 F (36.8 C) (Oral)   Resp 16   Ht  (1.727 m)   Wt 220 lb (99.8 kg)   LMP 07/07/2017 (Approximate)   SpO2 99%   BMI 33.45 kg/m   Visual Acuity Right Eye Distance:   Left Eye Distance:   Bilateral Distance:    Right Eye Near:   Left Eye Near:    Bilateral Near:     Physical Exam  Constitutional: She is oriented to person, place, and time. She appears well-developed and well-nourished. No  distress.  HENT:  Head: Normocephalic and atraumatic.  Right Ear: Hearing, tympanic membrane, external ear and ear canal normal.  Left Ear: Hearing, tympanic membrane, external ear and ear canal normal.  Nose: Rhinorrhea present.  Mouth/Throat: Uvula is midline and mucous membranes are normal. No trismus in the jaw. No uvula swelling. Posterior oropharyngeal erythema present. No oropharyngeal exudate, posterior oropharyngeal edema or tonsillar abscesses. No tonsillar exudate.  Eyes: Conjunctivae are normal. Right eye exhibits no discharge. Left eye exhibits no discharge.  Neck: Normal range of motion.  Cardiovascular: Normal rate and regular rhythm.  Pulmonary/Chest: Effort normal and breath sounds normal. No stridor. No respiratory distress. She has no wheezes. She has no rales.  Abdominal: Soft. She exhibits no distension. There is no tenderness.  Musculoskeletal: Normal range of motion. She exhibits no deformity.  Lymphadenopathy:    She has cervical adenopathy.  Neurological: She is alert and oriented to person, place, and time. She has normal reflexes.  Skin: Skin is warm and dry.  Psychiatric: She has a normal mood and affect. Her behavior is normal. Thought content normal.     UC Treatments / Results  Labs (all labs ordered are listed, but only abnormal results are displayed) Labs Reviewed  RAPID STREP SCREEN ([facility] ONLY) - Abnormal; Notable for the following components:      Result Value   Streptococcus, Group A Screen (Direct) POSITIVE (*)    All other components within normal limits    EKG None Radiology No results found.  Procedures Procedures (including critical care time)  Medications Ordered in UC Medications - No data to display   Initial Impression / Assessment and Plan / UC Course  I have reviewed the triage vital signs and the nursing notes.  Pertinent labs & imaging results that were available during my care of the patient were reviewed by me and  considered in my medical decision making (see chart for details).     37 year old female with sore throat.  Physical exam and history consistent with possible strep.  We will go ahead and treat with amoxicillin.  Patient understands signs and symptoms return to clinic for.  Final Clinical Impressions(s) / UC Diagnoses   Final diagnoses:  Strep pharyngitis    ED Discharge Orders        Ordered    amoxicillin (AMOXIL) 875 MG tablet  2 times daily     08/07/17 1514  Evon Slack, [place] 08/07/17 1527

## 2017-08-19 ENCOUNTER — Ambulatory Visit
Admission: EM | Admit: 2017-08-19 | Discharge: 2017-08-19 | Disposition: A | Discharge status: Home / Self Care | Payer: Self-pay | Attending: Family Medicine | Admitting: Family Medicine

## 2017-08-19 ENCOUNTER — Other Ambulatory Visit: Payer: Self-pay

## 2017-08-19 DIAGNOSIS — G43009 Migraine without aura, not intractable, without status migrainosus: Secondary | ICD-10-CM

## 2017-08-19 MED ORDER — PROMETHAZINE HCL 25 MG/ML IJ SOLN
25.0000 mg | Freq: Once | INTRAMUSCULAR | Status: AC
  Administered 2017-08-19: 25 mg via INTRAMUSCULAR

## 2017-08-19 MED ORDER — SUMATRIPTAN SUCCINATE 50 MG PO TABS: ORAL_TABLET | ORAL | 1 refills | Status: DC

## 2017-08-19 MED ORDER — KETOROLAC TROMETHAMINE 60 MG/2ML IM SOLN
60.0000 mg | Freq: Once | INTRAMUSCULAR | Status: AC
  Administered 2017-08-19: 60 mg via INTRAMUSCULAR

## 2017-08-19 NOTE — ED Triage Notes (Signed)
Patient complains of a migraine with floaters x 2 days, sensitive to light and sound.

## 2017-08-19 NOTE — ED Provider Notes (Signed)
[facility]  CSN: [id number] Arrival date & time: 08/19/17  1541  History   Chief Complaint Chief Complaint  Patient presents with  . Migraine   HPI  37 year old female with a history of migraine presents with a migraine headache.  2 day history of headache. Left sided. Severe.  Pain is currently 7-8/10 in severity.  Associated nausea and vomiting.  Associated photophobia and visual disturbance.  Also reports phonophobia.  No known relieving factors.  No other associated symptoms.  No other complaints.  Past Medical History:  Diagnosis Date  . Anxiety   . Depression   . Endometriosis   . PTSD (post-traumatic stress disorder)    Past Surgical History:  Procedure Laterality Date  . CHOLECYSTECTOMY    . LEEP    . TUBAL LIGATION     OB History   None    Home Medications    Prior to Admission medications   Medication Sig Start Date End Date Taking? Authorizing Provider  fexofenadine-pseudoephedrine (ALLEGRA-D 24) 180-240 MG 24 hr tablet Take 1 tablet by mouth daily.   Yes [provider]  Multiple Vitamins-Minerals (WOMENS MULTIVITAMIN PO) Take 1 tablet by mouth daily.   Yes [provider]  SUMAtriptan (IMITREX) 50 MG tablet 1 tablet at onset of migraine. Additional dose in 2 hours if persists. 08/19/17   Tommie Sams, DO   Family History Family History  Problem Relation Age of Onset  . Breast cancer Paternal Aunt 30  . Breast cancer Paternal Grandmother   . Hypertension Mother   . Other Father        unknown medical history   Social History Social History   Tobacco Use  . Smoking status: Current Every Day Smoker    Packs/day: 1.00    Types: Cigarettes  . Smokeless tobacco: Never Used  Substance Use Topics  . Alcohol use: No  . Drug use: No   Allergies   Vicodin [hydrocodone-acetaminophen]  Review of Systems Review of Systems  Eyes: Positive for photophobia and visual disturbance.  Gastrointestinal: Positive for nausea and  vomiting.  Neurological:       Headache.   Physical Exam Triage Vital Signs ED Triage Vitals  Enc Vitals Group     BP 08/19/17 1600 116/81     Pulse Rate 08/19/17 1600 68     Resp 08/19/17 1600 18     Temp 08/19/17 1600 97.9 F (36.6 C)     Temp Source 08/19/17 1600 Oral     SpO2 08/19/17 1600 100 %     Weight 08/19/17 1558 230 lb (104.3 kg)     Height 08/19/17 1558  (1.727 m)     Head Circumference --      Peak Flow --      Pain Score 08/19/17 1558 8     Pain Loc --      Pain Edu? --      Excl. in GC? --    Updated Vital Signs BP 116/81 (BP Location: Left Arm)   Pulse 68   Temp 97.9 F (36.6 C) (Oral)   Resp 18   Ht  (1.727 m)   Wt 230 lb (104.3 kg)   LMP 08/15/2017   SpO2 100%   BMI 34.97 kg/m   Physical Exam  Constitutional: She is oriented to person, place, and time. She appears well-developed. No distress.  HENT:  Head: Normocephalic and atraumatic.  Mouth/Throat: Oropharynx is clear and moist.  Eyes: Pupils are  equal, round, and reactive to light. Conjunctivae are normal. Right eye exhibits no discharge. Left eye exhibits no discharge.  Cardiovascular: Normal rate and regular rhythm.  Pulmonary/Chest: Effort normal and breath sounds normal. She has no wheezes. She has no rales.  Neurological: She is alert and oriented to person, place, and time.  Psychiatric: Her behavior is normal.  Flat affect.  Nursing note and vitals reviewed.  UC Treatments / Results  Labs (all labs ordered are listed, but only abnormal results are displayed) Labs Reviewed - No data to display  EKG None  Radiology No results found.  Procedures Procedures (including critical care time)  Medications Ordered in UC Medications  ketorolac (TORADOL) injection 60 mg (60 mg Intramuscular Given 08/19/17 1617)  promethazine (PHENERGAN) injection 25 mg (25 mg Intramuscular Given 08/19/17 1618)    Initial Impression / Assessment and Plan / UC Course  I have reviewed the  triage vital signs and the nursing notes.  Pertinent labs & imaging results that were available during my care of the patient were reviewed by me and considered in my medical decision making (see chart for details).    37 year old female presents with migraine headache.  Given Toradol and Phenergan here.  Sending home on Imitrex.   Final Clinical Impressions(s) / UC Diagnoses   Final diagnoses:  Migraine without aura and without status migrainosus, not intractable   Discharge Instructions   None    ED Prescriptions    Medication Sig Dispense Auth. Provider   SUMAtriptan (IMITREX) 50 MG tablet 1 tablet at onset of migraine. Additional dose in 2 hours if persists. 10 tablet Tommie Sams, DO     Controlled Substance Prescriptions [place] Controlled Substance Registry consulted? Not Applicable   Tommie Sams, DO 08/19/17 1643

## 2017-11-15 ENCOUNTER — Emergency Department
Admission: EM | Admit: 2017-11-15 | Discharge: 2017-11-16 | Disposition: A | Discharge status: Home / Self Care | Payer: Self-pay | Attending: Emergency Medicine | Admitting: Emergency Medicine

## 2017-11-15 ENCOUNTER — Encounter: Payer: Self-pay | Admitting: Emergency Medicine

## 2017-11-15 DIAGNOSIS — G43909 Migraine, unspecified, not intractable, without status migrainosus: Secondary | ICD-10-CM | POA: Insufficient documentation

## 2017-11-15 DIAGNOSIS — F329 Major depressive disorder, single episode, unspecified: Secondary | ICD-10-CM | POA: Insufficient documentation

## 2017-11-15 DIAGNOSIS — Z79899 Other long term (current) drug therapy: Secondary | ICD-10-CM | POA: Insufficient documentation

## 2017-11-15 DIAGNOSIS — Z9049 Acquired absence of other specified parts of digestive tract: Secondary | ICD-10-CM | POA: Insufficient documentation

## 2017-11-15 DIAGNOSIS — F419 Anxiety disorder, unspecified: Secondary | ICD-10-CM | POA: Insufficient documentation

## 2017-11-15 DIAGNOSIS — F17210 Nicotine dependence, cigarettes, uncomplicated: Secondary | ICD-10-CM | POA: Insufficient documentation

## 2017-11-15 HISTORY — DX: Migraine, unspecified, not intractable, without status migrainosus: G43.909

## 2017-11-15 MED ORDER — DIPHENHYDRAMINE HCL 50 MG/ML IJ SOLN
12.5000 mg | INTRAMUSCULAR | Status: AC
  Administered 2017-11-15: 12.5 mg via INTRAVENOUS
  Filled 2017-11-15: qty 1

## 2017-11-15 MED ORDER — SODIUM CHLORIDE 0.9 % IV BOLUS
500.0000 mL | Freq: Once | INTRAVENOUS | Status: AC
  Administered 2017-11-15: 500 mL via INTRAVENOUS

## 2017-11-15 MED ORDER — KETOROLAC TROMETHAMINE 30 MG/ML IJ SOLN
15.0000 mg | Freq: Once | INTRAMUSCULAR | Status: AC
  Administered 2017-11-15: 15 mg via INTRAVENOUS
  Filled 2017-11-15: qty 1

## 2017-11-15 MED ORDER — ACETAMINOPHEN 500 MG PO TABS
1000.0000 mg | ORAL_TABLET | Freq: Once | ORAL | Status: AC
  Administered 2017-11-15: 1000 mg via ORAL
  Filled 2017-11-15: qty 2

## 2017-11-15 MED ORDER — DEXAMETHASONE SODIUM PHOSPHATE 10 MG/ML IJ SOLN
10.0000 mg | Freq: Once | INTRAMUSCULAR | Status: AC
  Administered 2017-11-15: 10 mg via INTRAVENOUS
  Filled 2017-11-15: qty 1

## 2017-11-15 MED ORDER — METOCLOPRAMIDE HCL 5 MG/ML IJ SOLN
10.0000 mg | INTRAMUSCULAR | Status: AC
  Administered 2017-11-15: 10 mg via INTRAVENOUS
  Filled 2017-11-15: qty 2

## 2017-11-15 NOTE — ED Provider Notes (Signed)
[facility] Emergency Department Provider Note  ____________________________________________   First MD Initiated Contact with Patient 11/15/17 2300     (approximate)  I have reviewed the triage vital signs and the nursing notes.   HISTORY  Chief Complaint Migraine    HPI Cassandra Pena is a 37 y.o. female with medical history as listed below who presents for evaluation of which she describes as a migraine.  She reports that she woke up for work at 4:30 AM yesterday as she usually does and had a severe global headache that is worse in the front and feels consistent with prior migraines.  She says that nothing particular has made it better or worse.  She stayed home from work and stayed in bed which helped a little bit but then her kids came home and she had to get up and move around and perform her usual daily activities which made the headache worse.  She says that she has been dealing with some sinusitis recently and feels stopped up but has no other symptoms.  Specifically she denies fever/chills, chest pain, numbness or weakness in her extremities, visual changes other than photophobia, abdominal pain, difficulty with ambulation, difficulty speaking.  She has had nausea and several episodes of vomiting which she associates with all of her prior migraines.  She reports that she had migraines earlier in her life and then went 10 years without a but over the last couple of years has had more frequent migraines.  She describes the pain is severe.  She formally took Imitrex but has not had a prescription for Imitrex for an extended period of time and does not have insurance or primary care provider.  Past Medical History:  Diagnosis Date  . Anxiety   . Depression   . Endometriosis   . Migraines   . PTSD (post-traumatic stress disorder)     There are no active problems to display for this patient.   Past Surgical History:  Procedure Laterality Date  .  CHOLECYSTECTOMY    . LEEP    . TUBAL LIGATION      Prior to Admission medications   Medication Sig Start Date End Date Taking? Authorizing Provider  fexofenadine-pseudoephedrine (ALLEGRA-D 24) 180-240 MG 24 hr tablet Take 1 tablet by mouth daily.    [provider]  Multiple Vitamins-Minerals (WOMENS MULTIVITAMIN PO) Take 1 tablet by mouth daily.    [provider]  SUMAtriptan (IMITREX) 50 MG tablet 1 tablet at onset of migraine. Additional dose in 2 hours if persists. 08/19/17   Tommie Samsook, Jayce G, DO    Allergies Vicodin [hydrocodone-acetaminophen]  Family History  Problem Relation Age of Onset  . Breast cancer Paternal Aunt 30  . Breast cancer Paternal Grandmother   . Hypertension Mother   . Other Father        unknown medical history    Social History Social History   Tobacco Use  . Smoking status: Current Every Day Smoker    Packs/day: 1.00    Types: Cigarettes  . Smokeless tobacco: Never Used  Substance Use Topics  . Alcohol use: No  . Drug use: No    Review of Systems Constitutional: No fever/chills Eyes: No visual changes.  No swelling around the eyes.  Some photophobia. ENT: Recent sinus infection.  No sore throat. Cardiovascular: Denies chest pain. Respiratory: Denies shortness of breath. Gastrointestinal: No abdominal pain.  No nausea, no vomiting.  No diarrhea.  No constipation. Genitourinary: Negative for dysuria. Musculoskeletal:  Negative for neck pain.  Negative for back pain. Integumentary: Negative for rash. Neurological: Negative for headaches, focal weakness or numbness.   ____________________________________________   PHYSICAL EXAM:  VITAL SIGNS: ED Triage Vitals  Enc Vitals Group     BP 11/15/17 2136 104/86     Pulse Rate 11/15/17 2136 80     Resp --      Temp 11/15/17 2136 98.4 F (36.9 C)     Temp Source 11/15/17 2136 Oral     SpO2 11/15/17 2136 97 %     Weight 11/15/17 2137 88.5 kg (195 lb)     Height 11/15/17 2137  1.727 m (5\' 8" )     Head Circumference --      Peak Flow --      Pain Score --      Pain Loc --      Pain Edu? --      Excl. in GC? --     Constitutional: Alert and oriented.  Generally well-appearing but does appear somewhat uncomfortable, does no acute distress at this time Eyes: Conjunctivae are normal. PERRL. EOMI. mild photophobia.  No periorbital swelling nor exophthalmos. Head: Atraumatic. Nose: No congestion/rhinnorhea. Mouth/Throat: Mucous membranes are moist. Neck: No stridor.  No meningeal signs.   Cardiovascular: Normal rate, regular rhythm. Good peripheral circulation. Grossly normal heart sounds. Respiratory: Normal respiratory effort.  No retractions. Lungs CTAB. Gastrointestinal: Soft and nontender. No distention.  Musculoskeletal: No lower extremity tenderness nor edema. No gross deformities of extremities. Neurologic:  Normal speech and language. No gross focal neurologic deficits are appreciated.  Skin:  Skin is warm, dry and intact. No rash noted. Psychiatric: Mood and affect are normal. Speech and behavior are normal.  ____________________________________________   LABS (all labs ordered are listed, but only abnormal results are displayed)  Labs Reviewed - No data to display ____________________________________________  EKG  None - EKG not ordered by ED physician ____________________________________________  RADIOLOGY   ED MD interpretation:  No indication for imaging  Official radiology report(s): No results found.  ____________________________________________   PROCEDURES  Critical Care performed: No   Procedure(s) performed:   Procedures   ____________________________________________   INITIAL IMPRESSION / ASSESSMENT AND PLAN / ED COURSE  As part of my medical decision making, I reviewed the following data within the electronic MEDICAL RECORD NUMBER Nursing notes reviewed and incorporated and Notes from prior ED visits      Differential diagnosis includes, but is not limited to, migraine or migraine equivalent, intracranial hemorrhage, meningitis/encephalitis, previous head trauma, cavernous venous thrombosis or similar condition, tension headache, temporal arteritis, idiopathic intracranial hypertension, and non-specific headache.  The patient has a long history of migraines and she reports that this feels just like her usual migraine.  The only thing that raises my degree of suspicion slightly is her recent sinus infection which could in theory raise the risk of a condition such as cavernous sinus thrombosis.  However, she has no physical exam findings that are concerning, no abnormal vital signs, no neurological symptoms, no recent infectious symptoms other than the nasal congestion, etc.  She is quite reassuring on physical exam and says repeatedly that this feels like her usual migraine.  I explained to her my slightly increased concern given the sinusitis but suggested that we treat her empirically with a "migraine cocktail" and then reassess.  If she is not feeling any better or only minimally better that we can consider imaging such as a CTV or MRI to rule out acute  intracranial pathology.  She understands and agrees with the plan.  I am treating with metoclopramide 50 mill grams IV, Benadryl 12.5 mg IV, Toradol 15 mg IV, Decadron 10 mill grams IV, Tylenol 1000 mg p.o., and 500 mL normal saline IV bolus.  I will then reassess to determine the need for additional evaluation.   Clinical Course as of Nov 17 115  Wed Nov 16, 2017  0114 Patient reports that the pain has almost completely gone away and she says that she is ready to go home.  I gave my usual and customary return precautions.   [CF]    Clinical Course User Index [CF] Loleta Rose, MD    ____________________________________________  FINAL CLINICAL IMPRESSION(S) / ED DIAGNOSES  Final diagnoses:  Migraine without status migrainosus, not  intractable, unspecified migraine type     MEDICATIONS GIVEN DURING THIS VISIT:  Medications  ketorolac (TORADOL) 30 MG/ML injection 15 mg (15 mg Intravenous Given 11/15/17 2352)  dexamethasone (DECADRON) injection 10 mg (10 mg Intravenous Given 11/15/17 2352)  metoCLOPramide (REGLAN) injection 10 mg (10 mg Intravenous Given 11/15/17 2352)  acetaminophen (TYLENOL) tablet 1,000 mg (1,000 mg Oral Given 11/15/17 2355)  sodium chloride 0.9 % bolus 500 mL (500 mLs Intravenous New Bag/Given 11/15/17 2355)  diphenhydrAMINE (BENADRYL) injection 12.5 mg (12.5 mg Intravenous Given 11/15/17 2352)     ED Discharge Orders    None       Note:  This document was prepared using Dragon voice recognition software and may include unintentional dictation errors.    Loleta Rose, MD 11/16/17 [phone]

## 2017-11-15 NOTE — ED Triage Notes (Signed)
Pt c/o HA that started at 0400 this AM and has not had relief by OTC medications. Pt with hx/o migraines in past. Pt with current sinus infection.

## 2017-11-16 MED ORDER — SUMATRIPTAN SUCCINATE 50 MG PO TABS: ORAL_TABLET | ORAL | 1 refills | Status: DC

## 2017-11-16 NOTE — Discharge Instructions (Signed)

## 2017-12-18 ENCOUNTER — Emergency Department
Admission: EM | Admit: 2017-12-18 | Discharge: 2017-12-19 | Disposition: A | Discharge status: Home / Self Care | Payer: Self-pay | Attending: Emergency Medicine | Admitting: Emergency Medicine

## 2017-12-18 ENCOUNTER — Encounter: Payer: Self-pay | Admitting: Medical Oncology

## 2017-12-18 DIAGNOSIS — Z046 Encounter for general psychiatric examination, requested by authority: Secondary | ICD-10-CM | POA: Insufficient documentation

## 2017-12-18 DIAGNOSIS — F1410 Cocaine abuse, uncomplicated: Secondary | ICD-10-CM | POA: Insufficient documentation

## 2017-12-18 DIAGNOSIS — F339 Major depressive disorder, recurrent, unspecified: Secondary | ICD-10-CM | POA: Insufficient documentation

## 2017-12-18 DIAGNOSIS — F17210 Nicotine dependence, cigarettes, uncomplicated: Secondary | ICD-10-CM | POA: Insufficient documentation

## 2017-12-18 DIAGNOSIS — Z79899 Other long term (current) drug therapy: Secondary | ICD-10-CM | POA: Insufficient documentation

## 2017-12-18 LAB — COMPREHENSIVE METABOLIC PANEL
ALT: 16 U/L (ref 0–44)
ANION GAP: 6 (ref 5–15)
AST: 21 U/L (ref 15–41)
Albumin: 4 g/dL (ref 3.5–5.0)
Alkaline Phosphatase: 65 U/L (ref 38–126)
BUN: 7 mg/dL (ref 6–20)
CHLORIDE: 105 mmol/L (ref 98–111)
CO2: 29 mmol/L (ref 22–32)
CREATININE: 0.84 mg/dL (ref 0.44–1.00)
Calcium: 9.1 mg/dL (ref 8.9–10.3)
Glucose, Bld: 89 mg/dL (ref 70–99)
POTASSIUM: 3.8 mmol/L (ref 3.5–5.1)
SODIUM: 140 mmol/L (ref 135–145)
Total Bilirubin: 0.4 mg/dL (ref 0.3–1.2)
Total Protein: 7 g/dL (ref 6.5–8.1)

## 2017-12-18 LAB — CBC
HCT: 43.4 % (ref 35.0–47.0)
HEMOGLOBIN: 14.7 g/dL (ref 12.0–16.0)
MCH: 29.6 pg (ref 26.0–34.0)
MCHC: 33.8 g/dL (ref 32.0–36.0)
MCV: 87.6 fL (ref 80.0–100.0)
PLATELETS: 284 10*3/uL (ref 150–440)
RBC: 4.96 MIL/uL (ref 3.80–5.20)
RDW: 13.2 % (ref 11.5–14.5)
WBC: 12 10*3/uL — ABNORMAL HIGH (ref 3.6–11.0)

## 2017-12-18 LAB — URINE DRUG SCREEN, QUALITATIVE (ARMC ONLY)
Amphetamines, Ur Screen: NOT DETECTED
BARBITURATES, UR SCREEN: NOT DETECTED
Benzodiazepine, Ur Scrn: POSITIVE — AB
CANNABINOID 50 NG, UR ~~LOC~~: POSITIVE — AB
COCAINE METABOLITE, UR ~~LOC~~: POSITIVE — AB
MDMA (ECSTASY) UR SCREEN: NOT DETECTED
METHADONE SCREEN, URINE: NOT DETECTED
Opiate, Ur Screen: NOT DETECTED
Phencyclidine (PCP) Ur S: NOT DETECTED
TRICYCLIC, UR SCREEN: NOT DETECTED

## 2017-12-18 LAB — SALICYLATE LEVEL

## 2017-12-18 LAB — ACETAMINOPHEN LEVEL

## 2017-12-18 LAB — ETHANOL

## 2017-12-18 NOTE — ED Notes (Signed)
Pt dressed out in burgundy scrubs.  Belongings, which include flip flops, socks, pants, underwear, tshirt, bra, silver colored bracelet, tongue ring (silver colored) and hair tie.  Pt denies cell phone or any valuables that needs to be locked up.  Pt ambulated to [facility]

## 2017-12-18 NOTE — ED Notes (Signed)
IVC/SOC ordered/called waiting on call back from Md for evaluation 

## 2017-12-18 NOTE — ED Notes (Signed)
Hourly rounding reveals patient sleeping in hall bed. No complaints, stable, in no acute distress. Q15 minute rounds and monitoring via Rover and Officer to continue.  

## 2017-12-18 NOTE — ED Notes (Signed)
Hourly rounding reveals patient in sleeping in hall bed. No complaints, stable, in no acute distress. Q15 minute rounds and monitoring via Psychologist, counselling to continue.

## 2017-12-18 NOTE — ED Triage Notes (Signed)
Pt here with mom who reports pt has been dealing more frequently with depression. Pt reports the depression is becoming debilitating affecting her work and social life. Pt has in the past been placed on zoloft and it helped. Pt reports that she has been having thoughts of harming herself. Has told her mother that she was going to "blow her brains out" recently. Pt states that she does not have access to a gun. Pt tearful in triage.

## 2017-12-18 NOTE — ED Provider Notes (Signed)
[facility] Emergency Department Provider Note   ____________________________________________   First MD Initiated Contact with Patient 12/18/17 2040     (approximate)  I have reviewed the triage vital signs and the nursing notes.   HISTORY  Chief Complaint Depression    HPI Cassandra Pena is a 37 y.o. female who reports she is been feeling more more depressed.  He continued to affect her work.  She is been sleeping a lot.  She is told her mother recently she was now blow her brains out although she does not have a gun.  She does not have primary care at this point either and has been working on trying to get one.   Past Medical History:  Diagnosis Date  . Anxiety   . Depression   . Endometriosis   . Migraines   . PTSD (post-traumatic stress disorder)     There are no active problems to display for this patient.   Past Surgical History:  Procedure Laterality Date  . CHOLECYSTECTOMY    . LEEP    . TUBAL LIGATION      Prior to Admission medications   Medication Sig Start Date End Date Taking? Authorizing Provider  fexofenadine-pseudoephedrine (ALLEGRA-D 24) 180-240 MG 24 hr tablet Take 1 tablet by mouth daily.    [provider]  Multiple Vitamins-Minerals (WOMENS MULTIVITAMIN PO) Take 1 tablet by mouth daily.    [provider]  SUMAtriptan (IMITREX) 50 MG tablet 1 tablet at onset of migraine. Additional dose in 2 hours if persists. 11/16/17   Loleta Rose, MD    Allergies Vicodin [hydrocodone-acetaminophen]  Family History  Problem Relation Age of Onset  . Breast cancer Paternal Aunt 30  . Breast cancer Paternal Grandmother   . Hypertension Mother   . Other Father        unknown medical history    Social History Social History   Tobacco Use  . Smoking status: Current Every Day Smoker    Packs/day: 1.00    Types: Cigarettes  . Smokeless tobacco: Never Used  Substance Use Topics  . Alcohol use: No  .  Drug use: No    Review of Systems  Constitutional: No fever/chills Eyes: No visual changes. ENT: No sore throat. Cardiovascular: Denies chest pain. Respiratory: Denies shortness of breath. Gastrointestinal: No abdominal pain.  No nausea, no vomiting.  No diarrhea.  No constipation. Genitourinary: Negative for dysuria. Musculoskeletal: Negative for back pain. Skin: Negative for rash. Neurological: Negative for headaches, focal weakness   ____________________________________________   PHYSICAL EXAM:  VITAL SIGNS: ED Triage Vitals  Enc Vitals Group     BP 12/18/17 2021 (!) 146/91     Pulse Rate 12/18/17 2021 86     Resp 12/18/17 2021 18     Temp 12/18/17 2021 98.3 F (36.8 C)     Temp Source 12/18/17 2021 Oral     SpO2 12/18/17 2021 99 %     Weight 12/18/17 2022 187 lb (84.8 kg)     Height 12/18/17 2022 5\' 8"  (1.727 m)     Head Circumference --      Peak Flow --      Pain Score 12/18/17 2052 0     Pain Loc --      Pain Edu? --      Excl. in GC? --     Constitutional: Alert and oriented. Well appearing and in no acute distress. Eyes: Conjunctivae are normal. PERRL. EOMI. Head: Atraumatic. Nose: No  congestion/rhinnorhea. Mouth/Throat: Mucous membranes are moist.  Oropharynx non-erythematous. Neck: No stridor.  Cardiovascular: Normal rate, regular rhythm. Grossly normal heart sounds.  Good peripheral circulation. Respiratory: Normal respiratory effort.  No retractions. Lungs CTAB. Gastrointestinal: Soft and nontender. No distention. No abdominal bruits.  Musculoskeletal: No lower extremity tenderness nor edema.  Neurologic:  Normal speech and language. No gross focal neurologic deficits are appreciated.  Skin:  Skin is warm, dry and intact. No rash noted. Psychiatric: Mood and affect are slightly depressed speech and behavior are normal.  ____________________________________________   LABS (all labs ordered are listed, but only abnormal results are  displayed)  Labs Reviewed  ACETAMINOPHEN LEVEL - Abnormal; Notable for the following components:      Result Value   Acetaminophen (Tylenol), Serum <10 (*)    All other components within normal limits  CBC - Abnormal; Notable for the following components:   WBC 12.0 (*)    All other components within normal limits  URINE DRUG SCREEN, QUALITATIVE ([facility] ONLY) - Abnormal; Notable for the following components:   Cocaine Metabolite,Ur [place] POSITIVE (*)    Cannabinoid 50 Ng, Ur [place] POSITIVE (*)    Benzodiazepine, Ur Scrn POSITIVE (*)    All other components within normal limits  COMPREHENSIVE METABOLIC PANEL  ETHANOL  SALICYLATE LEVEL  POC URINE PREG, ED   ____________________________________________  EKG   ____________________________________________  RADIOLOGY  ED MD interpretation:   Official radiology report(s): No results found.  ____________________________________________   PROCEDURES  Procedure(s) performed:   Procedures  Critical Care performed:   ____________________________________________   INITIAL IMPRESSION / ASSESSMENT AND PLAN / ED COURSE  We will consult tele-psychiatry and see what they suggest to treat her.         ____________________________________________   FINAL CLINICAL IMPRESSION(S) / ED DIAGNOSES  Final diagnoses:  Episode of recurrent major depressive disorder, unspecified depression episode severity [facility])     ED Discharge Orders    None       Note:  This document was prepared using Dragon voice recognition software and may include unintentional dictation errors.    Arnaldo Natal, MD 12/19/17 [phone]

## 2017-12-18 NOTE — ED Notes (Signed)
Pt. Transferred from Triage to room after dressing out and screening for contraband. Pt. Oriented to Quad including Q15 minute rounds as well as Rover and Officer for their protection. Patient is alert and oriented, warm and dry in no acute distress. Patient denies SI, HI, and AVH. Pt. Encouraged to let me know if needs arise.  

## 2017-12-19 MED ORDER — SERTRALINE HCL 50 MG PO TABS: 50.0000 mg | ORAL_TABLET | ORAL | 0 refills | Status: DC

## 2017-12-19 NOTE — Discharge Instructions (Signed)
Please begin taking your Zoloft 25 mg by mouth every morning for 6 days and then increase your dose to 50 mg by mouth every morning until you can follow-up with both psychiatry and primary care.  It was a pleasure to take care of you today, and thank you for coming to our emergency department.  If you have any questions or concerns before leaving please ask the nurse to grab me and I'm more than happy to go through your aftercare instructions again.  If you were prescribed any opioid pain medication today such as Norco, Vicodin, Percocet, morphine, hydrocodone, or oxycodone please make sure you do not drive when you are taking this medication as it can alter your ability to drive safely.  If you have any concerns once you are home that you are not improving or are in fact getting worse before you can make it to your follow-up appointment, please do not hesitate to call [phone] and come back for further evaluation.  Merrily Brittle, MD  Results for orders placed or performed during the hospital encounter of 12/18/17  Comprehensive metabolic panel  Result Value Ref Range   Sodium 140 135 - 145 mmol/L   Potassium 3.8 3.5 - 5.1 mmol/L   Chloride 105 98 - 111 mmol/L   CO2 29 22 - 32 mmol/L   Glucose, Bld 89 70 - 99 mg/dL   BUN 7 6 - 20 mg/dL   Creatinine, Ser 7.07 0.44 - 1.00 mg/dL   Calcium 9.1 8.9 - 86.7 mg/dL   Total Protein 7.0 6.5 - 8.1 g/dL   Albumin 4.0 3.5 - 5.0 g/dL   AST 21 15 - 41 U/L   ALT 16 0 - 44 U/L   Alkaline Phosphatase 65 38 - 126 U/L   Total Bilirubin 0.4 0.3 - 1.2 mg/dL   GFR calc non Af Amer >60 >60 mL/min   GFR calc Af Amer >60 >60 mL/min   Anion gap 6 5 - 15  Ethanol  Result Value Ref Range   Alcohol, Ethyl (B) <10 <10 mg/dL  Salicylate level  Result Value Ref Range   Salicylate Lvl <7.0 2.8 - 30.0 mg/dL  Acetaminophen level  Result Value Ref Range   Acetaminophen (Tylenol), Serum <10 (L) 10 - 30 ug/mL  cbc  Result Value Ref Range   WBC 12.0 (H) 3.6 - 11.0 K/uL     RBC 4.96 3.80 - 5.20 MIL/uL   Hemoglobin 14.7 12.0 - 16.0 g/dL   HCT 54.4 92.0 - 10.0 %   MCV 87.6 80.0 - 100.0 fL   MCH 29.6 26.0 - 34.0 pg   MCHC 33.8 32.0 - 36.0 g/dL   RDW 71.2 19.7 - 58.8 %   Platelets 284 150 - 440 K/uL  Urine Drug Screen, Qualitative  Result Value Ref Range   Tricyclic, Ur Screen NONE DETECTED NONE DETECTED   Amphetamines, Ur Screen NONE DETECTED NONE DETECTED   MDMA (Ecstasy)Ur Screen NONE DETECTED NONE DETECTED   Cocaine Metabolite,Ur [place] POSITIVE (A) NONE DETECTED   Opiate, Ur Screen NONE DETECTED NONE DETECTED   Phencyclidine (PCP) Ur S NONE DETECTED NONE DETECTED   Cannabinoid 50 Ng, Ur [place] POSITIVE (A) NONE DETECTED   Barbiturates, Ur Screen NONE DETECTED NONE DETECTED   Benzodiazepine, Ur Scrn POSITIVE (A) NONE DETECTED   Methadone Scn, Ur NONE DETECTED NONE DETECTED

## 2017-12-19 NOTE — ED Notes (Signed)
Hourly rounding reveals patient in hall bed. No complaints, stable, in no acute distress. Q15 minute rounds and monitoring via Rover and Officer to continue.  

## 2017-12-19 NOTE — ED Notes (Signed)
Hourly rounding reveals patient sleeping in hall bed. No complaints, stable, in no acute distress. Q15 minute rounds and monitoring via Rover and Officer to continue.  

## 2017-12-19 NOTE — ED Notes (Signed)
Pt. Talking to SOC. 
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
## 2017-12-19 NOTE — ED Notes (Signed)
SOC completed recommend D/C with follow up & release from IVC

## 2017-12-19 NOTE — ED Provider Notes (Signed)
Psychiatrist on-call recommends the patient be discharged with outpatient management.  Recommends prescribing Zoloft 25 mg by mouth every morning for 6 days and then increasing to 50 mg by mouth every morning and then following up in 2 to 3 weeks.   Merrily Brittle, MD 12/19/17 Earle Gell

## 2018-06-25 ENCOUNTER — Other Ambulatory Visit: Payer: Self-pay

## 2018-06-25 ENCOUNTER — Encounter: Payer: Self-pay | Admitting: Emergency Medicine

## 2018-06-25 ENCOUNTER — Ambulatory Visit
Admission: EM | Admit: 2018-06-25 | Discharge: 2018-06-25 | Disposition: A | Discharge status: Home / Self Care | Payer: Self-pay | Attending: Emergency Medicine | Admitting: Emergency Medicine

## 2018-06-25 DIAGNOSIS — J0140 Acute pansinusitis, unspecified: Secondary | ICD-10-CM

## 2018-06-25 DIAGNOSIS — F17210 Nicotine dependence, cigarettes, uncomplicated: Secondary | ICD-10-CM

## 2018-06-25 MED ORDER — FLUTICASONE PROPIONATE 50 MCG/ACT NA SUSP: 2.0000 | Freq: Every day | NASAL | 0 refills | Status: DC

## 2018-06-25 MED ORDER — IBUPROFEN 600 MG PO TABS: 600.0000 mg | ORAL_TABLET | Freq: Four times a day (QID) | ORAL | 0 refills | Status: DC | PRN

## 2018-06-25 MED ORDER — DOXYCYCLINE HYCLATE 100 MG PO CAPS: 100.0000 mg | ORAL_CAPSULE | Freq: Two times a day (BID) | ORAL | 0 refills | Status: AC

## 2018-06-25 NOTE — Discharge Instructions (Addendum)
Take the medication as written. Start Mucinex-D to keep the mucous thin and to decongest you.  Return to the ER if you get worse, have a fever >100.4, or for any concerns. You may take 600 mg of motrin with 1 gram of tylenol up to 3-4 times a day as needed for pain. This is an effective combination for pain.   Use a NeilMed sinus rinse as often as you want to to reduce nasal congestion. Follow the directions on the box.   Go to www.goodrx.com to look up your medications. This will give you a list of where you can find your prescriptions at the most affordable prices. Or you can ask the pharmacist what the cash price is. This is frequently cheaper than going through insurance.  Here is a list of primary care providers who are taking new patients:  Dr. Elizabeth Sauer, Dr. Schuyler Amor [address] [place] [address] [phone]  [facility] [address] [place] [address]  [phone]  [facility] [address] [place], [place] [address] [phone]  [facility] [address] [place]  [phone] [place], [place] [address]  Here are clinics/ other resources who will see you if you do not have insurance. Some have certain criteria that you must meet. Call them and find out what they are:  [facility]: [address] [place], [place] [address] Phone: [phone] Hours: First and Third Saturdays of each Month, 9 a.m. - 1 p.m.  [facility]: [address], [address] [place], [place] [address] Phone: [phone] Hours: Tuesday, 4 p.m. - 8 p.m. Thursday, 1 p.m. - 8 p.m. Wednesday, 9 a.m. - [facility] [address] [place], [place] [address] Phone: [phone] Pharmacy Phone Number: [phone] Dental Phone Number: [phone] [facility] Insurance Help: [phone]  Dental Hours: Monday - Thursday, 8 a.m. - 6 p.m.  [facility] [address] [place],  [place] [address] Phone: [phone] Pharmacy Phone Number: [phone] [facility] Insurance Help: [phone]  [facility] [address] [place] [place], [place] [address] Phone: [phone] Pharmacy Phone Number: [phone] [facility] Insurance Help: [phone]  [facility] [address] [place], [place] [address] Phone: [phone] [facility] Insurance Help: [phone]   [facility] [address], [place], [place] [address] Phone: [phone]

## 2018-06-25 NOTE — ED Triage Notes (Signed)
Patient c/o sinus congestion and pressure, stuffy nose and HAs that started 2 weeks ago.  aptient has not taken her temperature.

## 2018-06-25 NOTE — ED Provider Notes (Signed)
HPI  SUBJECTIVE:  Cassandra Pena is a 38 y.o. female who presents with 2 to 3 weeks of "sinus trouble".  She was seen at another urgent care, prescribed 10 days of amoxicillin..  States that she finished this last week.  She got somewhat better, but not completely so.  States her symptoms returned shortly after and are getting worse.  She reports facial swelling, sinus pain and pressure, ear popping, nasal congestion, green rhinorrhea, postnasal drip, upper dental pain.  No fevers, cough.  Questionable allergy symptoms with sneezing, watery eyes.  No antipyretic in the past 4 to 6 hours.  She has tried amoxicillin, Allegra-D, Mucinex D and Claritin.  The amoxicillin helps.  No aggravating factors.  She is a smoker, has a history of cocaine use.  Last used 2.5 months ago.  No history of diabetes, frequent sinusitis.  LMP: 1 week ago.  Denies the possibility being pregnant.  PMD: None.  Past Medical History:  Diagnosis Date  . Anxiety   . Depression   . Endometriosis   . Migraines   . PTSD (post-traumatic stress disorder)     Past Surgical History:  Procedure Laterality Date  . CHOLECYSTECTOMY    . LEEP    . TUBAL LIGATION      Family History  Problem Relation Age of Onset  . Breast cancer Paternal Aunt 30  . Breast cancer Paternal Grandmother   . Hypertension Mother   . Other Father        unknown medical history    Social History   Tobacco Use  . Smoking status: Current Every Day Smoker    Packs/day: 1.00    Types: Cigarettes  . Smokeless tobacco: Never Used  Substance Use Topics  . Alcohol use: No  . Drug use: No    No current facility-administered medications for this encounter.   Current Outpatient Medications:  Marland Kitchen  Multiple Vitamins-Minerals (WOMENS MULTIVITAMIN PO), Take 1 tablet by mouth daily., Disp: , Rfl:  .  sertraline (ZOLOFT) 50 MG tablet, Take 1 tablet (50 mg total) by mouth every morning. Please begin by taking 25 mg by mouth every morning for the first  6 days and then increasing to 50 mg by mouth every morning, Disp: 30 tablet, Rfl: 0 .  doxycycline (VIBRAMYCIN) 100 MG capsule, Take 1 capsule (100 mg total) by mouth 2 (two) times daily for 7 days., Disp: 14 capsule, Rfl: 0 .  fluticasone (FLONASE) 50 MCG/ACT nasal spray, Place 2 sprays into both nostrils daily., Disp: 16 g, Rfl: 0 .  ibuprofen (ADVIL,MOTRIN) 600 MG tablet, Take 1 tablet (600 mg total) by mouth every 6 (six) hours as needed., Disp: 30 tablet, Rfl: 0 .  SUMAtriptan (IMITREX) 50 MG tablet, 1 tablet at onset of migraine. Additional dose in 2 hours if persists., Disp: 10 tablet, Rfl: 1  Allergies  Allergen Reactions  . Vicodin [Hydrocodone-Acetaminophen]     itching     ROS  As noted in HPI.   Physical Exam  BP (!) 128/99 (BP Location: Left Arm)   Pulse 78   Temp 97.6 F (36.4 C) (Oral)   Resp 16   Ht 5\' 8"  (1.727 m)   Wt 83.9 kg   LMP 06/13/2018 (Approximate)   SpO2 100%   BMI 28.13 kg/m   Constitutional: Well developed, well nourished, appears uncomfortable Eyes:  EOMI, conjunctiva normal bilaterally HENT: Normocephalic, atraumatic,mucus membranes moist.  purulent nasal congestion.  Positive frontal and maxillary sinus tenderness.  Normal turbinates.  No appreciable facial swelling.  TMs normal bilaterally. Respiratory: Normal inspiratory effort Cardiovascular: Normal rate GI: nondistended skin: No rash, skin intact Musculoskeletal: no deformities Neurologic: Alert & oriented x 3, no focal neuro deficits Psychiatric: Speech and behavior appropriate   ED Course   Medications - No data to display  No orders of the defined types were placed in this encounter.   No results found for this or any previous visit (from the past 24 hour(s)). No results found.  ED Clinical Impression  Acute non-recurrent pansinusitis   ED Assessment/Plan  Patient with a persistent sinusitis.  Home with Flonase, saline nasal irrigation, discontinue the antihistamines  because they are not working, start Mucinex D.  Ibuprofen 600 mg combined with 1 g of Tylenol.  Will try doxycycline for 7 days.  Consider fluoroquinolones if she does not respond with doxy.  provided primary care referral list for ongoing care.  Discussed  MDM, treatment plan, and plan for follow-up with patient. patient agrees with plan.   Meds ordered this encounter  Medications  . fluticasone (FLONASE) 50 MCG/ACT nasal spray    Sig: Place 2 sprays into both nostrils daily.    Dispense:  16 g    Refill:  0  . doxycycline (VIBRAMYCIN) 100 MG capsule    Sig: Take 1 capsule (100 mg total) by mouth 2 (two) times daily for 7 days.    Dispense:  14 capsule    Refill:  0  . ibuprofen (ADVIL,MOTRIN) 600 MG tablet    Sig: Take 1 tablet (600 mg total) by mouth every 6 (six) hours as needed.    Dispense:  30 tablet    Refill:  0    *This clinic note was created using Scientist, clinical (histocompatibility and immunogenetics). Therefore, there may be occasional mistakes despite careful proofreading.   ?    Domenick Gong, MD 06/26/18 [phone]

## 2018-06-26 ENCOUNTER — Telehealth: Payer: Self-pay | Admitting: Emergency Medicine

## 2018-06-26 NOTE — Telephone Encounter (Signed)
Pt called stating that Dr. Chaney Malling told her that she could call back and have an additional day out of work if she still was not feeling better after today. Spoke with Dr. Chaney Malling and she stated she did NOT say this and pt was NOT to get an additional note. Pt advised and she states she had a "low grade fever". Informed her that if that was the case she needed to come back in to be re-evalulated. Patient hung up the phone.

## 2018-12-01 ENCOUNTER — Encounter: Payer: Self-pay | Admitting: Emergency Medicine

## 2018-12-01 ENCOUNTER — Other Ambulatory Visit: Payer: Self-pay

## 2018-12-01 ENCOUNTER — Ambulatory Visit
Admission: EM | Admit: 2018-12-01 | Discharge: 2018-12-01 | Disposition: A | Discharge status: Home / Self Care | Payer: Self-pay | Attending: Urgent Care | Admitting: Urgent Care

## 2018-12-01 DIAGNOSIS — H66002 Acute suppurative otitis media without spontaneous rupture of ear drum, left ear: Secondary | ICD-10-CM

## 2018-12-01 MED ORDER — NEOMYCIN-POLYMYXIN-HC 3.5-10000-1 OT SUSP: 4.0000 [drp] | Freq: Three times a day (TID) | OTIC | 0 refills | Status: DC

## 2018-12-01 MED ORDER — AMOXICILLIN-POT CLAVULANATE 875-125 MG PO TABS: 1.0000 | ORAL_TABLET | Freq: Two times a day (BID) | ORAL | 0 refills | Status: DC

## 2018-12-01 NOTE — ED Provider Notes (Addendum)
Cassandra Pena, Cassandra Pena   Name: Cassandra Pena DOB: 01/20/1981 MRN: [id number] CSN: [id number] PCP: Cassandra Pena, Pcp Not In  Arrival date and time:  12/01/18 [id number]  Chief Complaint:  Otalgia (left)   NOTE: Prior to seeing the patient today, I have reviewed the triage nursing documentation and vital signs. Clinical staff has updated patient's PMH/PSHx, current medication list, and drug allergies/intolerances to ensure comprehensive history available to assist in medical decision making.   History:   HPI: Cassandra Pena is a 38 y.o. female who presents today with complaints of pain in her LEFT ear. Pain began with acute onset 3 days ago and has progressively worsened. Patient reports that the pain is radiating own into the LEFT ide of her neck an that her ear is swollen. She denies any associated fevers. Patient has not had any other recent upper respiratory symptoms; no cough, congestion, rhinorrhea, sneezing, or sore throat. She denies forceful nose blowing. Patient has appreciated some mild otorrhea (clear). She advises that her ability to hear from the ear has acutely changed with the onset of the pain; describes hearing as being muffled. Patient denies history of recurrent ear infections. Patient advising that she has not been swimming in the recent past. Patient uses cotton tip swabs to clean her ears. Patient has a history of seasonal allergies, however does not currently take any medications. Patient smokes. She denies any known sick contacts.   Past Medical History:  Diagnosis Date  . Anxiety   . Depression   . Endometriosis   . Migraines   . PTSD (post-traumatic stress disorder)     Past Surgical History:  Procedure Laterality Date  . CHOLECYSTECTOMY    . LEEP    . TUBAL LIGATION      Family History  Problem Relation Age of Onset  . Breast cancer Paternal Aunt 30  . Breast cancer Paternal Grandmother   . Hypertension Mother   . Other Father        unknown medical history     Social History   Tobacco Use  . Smoking status: Current Every Day Smoker    Packs/day: 1.00    Types: Cigarettes  . Smokeless tobacco: Never Used  Substance Use Topics  . Alcohol use: No  . Drug use: No    There are no active problems to display for this patient.   Home Medications:    Current Meds  Medication Sig  . sertraline (ZOLOFT) 50 MG tablet Take 1 tablet (50 mg total) by mouth every morning. Please begin by taking 25 mg by mouth every morning for the first 6 days and then increasing to 50 mg by mouth every morning  . SUMAtriptan (IMITREX) 50 MG tablet 1 tablet at onset of migraine. Additional dose in 2 hours if persists.    Allergies:   Vicodin [hydrocodone-acetaminophen]  Review of Systems (ROS): Review of Systems  Constitutional: Negative for chills and fever.  HENT: Positive for ear discharge and ear pain. Negative for congestion, sore throat and trouble swallowing.   Respiratory: Negative for cough and shortness of breath.   Cardiovascular: Negative for chest pain and palpitations.  Gastrointestinal: Negative for nausea and vomiting.  Musculoskeletal: Positive for neck pain. Negative for myalgias.  Skin: Negative for color change, pallor and rash.  Neurological: Negative for dizziness, syncope, weakness, numbness and headaches.  Psychiatric/Behavioral: The patient is nervous/anxious.   All other systems reviewed and are negative.    Vital Signs: Today's Vitals   12/01/18 0841 12/01/18  0844  BP:  125/83  Pulse:  79  Resp:  16  Temp:  98.6 F (37 C)  TempSrc:  Oral  SpO2:  98%  Weight: 220 lb (99.8 kg)   Height: 5\' 8"  (1.727 m)   PainSc: 8      Physical Exam: Physical Exam  Constitutional: She is oriented to person, place, and time and well-developed, well-nourished, and in no distress. She has a sickly appearance.  HENT:  Head: Normocephalic and atraumatic.  Right Ear: Hearing, tympanic membrane, external ear and ear canal normal.  Left Ear:  There is drainage, swelling and tenderness. Tympanic membrane is erythematous and bulging. A middle ear effusion is present. Decreased hearing is noted.  Mouth/Throat: Mucous membranes are normal.  Cardiovascular: Normal rate, regular rhythm, normal heart sounds and intact distal pulses. Exam reveals no gallop and no friction rub.  No murmur heard. Pulmonary/Chest: Effort normal and breath sounds normal. No respiratory distress. She has no wheezes. She has no rales.  Neurological: She is alert and oriented to person, place, and time. Gait normal. GCS score is 15.  Skin: Skin is warm and dry. No rash noted.  Psychiatric: Mood, memory, affect and judgment normal.  Nursing note and vitals reviewed.   Urgent Care Treatments / Results:   LABS: PLEASE NOTE: all labs that were ordered this encounter are listed, however only abnormal results are displayed. Labs Reviewed - No data to display  EKG: -None  RADIOLOGY: No results found.  PROCEDURES: Procedures  MEDICATIONS RECEIVED THIS VISIT: Medications - No data to display  PERTINENT CLINICAL COURSE NOTES/UPDATES:   Initial Impression / Assessment and Plan / Urgent Care Course:  Pertinent labs & imaging results that were available during my care of the patient were personally reviewed by me and considered in my medical decision making (see lab/imaging section of note for values and interpretations).  Cassandra Pena is a 38 y.o. female who presents to [facility] today with complaints of Otalgia (left)   Patient is ill appearing overall in clinic today. She does not appear to be in any acute distress. Presenting symptoms (see HPI) and exam as documented above. Discussed typical symptom constellation associated with SARS-CoV-2 (novel coronavirus). Patient agreeing to being tested today. Patient collected SARS-CoV-2 swab via facility approved self collection process today under the supervision of certified clinical staff. Discussed  variable turn around times associated with testing, as swabs are being processed at [facility], and have been taking as long as 7 days. She was advised to self quarantine, per [facility] DHHS guidelines, until negative results received.   Exam consistent with AOME with concurrent features of otitis externa (EAC swelling/erythema/drainage). Will treat for both internal (Augmentin) and external (Cortisporin) otitis. Patient encouraged to keep ear clean and dry. Dicussed supportive care measures at home during acute phase of illness. Patient to rest as much as possible. She was encouraged to ensure adequate hydration (water and ORS) to prevent dehydration and electrolyte derangements. Patient may use APAP and/or IBU on an as needed basis for pain/fever.   Discussed follow up with primary care physician in 1 week for re-evaluation. I have reviewed the follow up and strict return precautions for any new or worsening symptoms. Patient is aware of symptoms that would be deemed urgent/emergent, and would thus require further evaluation either here or in the emergency department. At the time of discharge, she verbalized understanding and consent with the discharge plan as it was reviewed with her. All questions were fielded by  provider and/or clinic staff prior to patient discharge.    Final Clinical Impressions / Urgent Care Diagnoses:   Final diagnoses:  Non-recurrent acute suppurative otitis media of left ear without spontaneous rupture of tympanic membrane    New Prescriptions:  [place] Controlled Substance Registry consulted? Not Applicable  Meds ordered this encounter  Medications  . amoxicillin-clavulanate (AUGMENTIN) 875-125 MG tablet    Sig: Take 1 tablet by mouth every 12 (twelve) hours.    Dispense:  20 tablet    Refill:  0  . neomycin-polymyxin-hydrocortisone (CORTISPORIN) 3.5-10000-1 OTIC suspension    Sig: Place 4 drops into the left ear 3 (three) times daily.    Dispense:  10 mL    Refill:  0     Recommended Follow up Care:  Patient encouraged to follow up with the following provider within the specified time frame, or sooner as dictated by the severity of her symptoms. As always, she was instructed that for any urgent/emergent care needs, she should seek care either here or in the emergency department for more immediate evaluation.  Follow-up Information    PCP In 1 week.   Why: General reassessment of symptoms if not improving        NOTE: This note was prepared using Scientist, clinical (histocompatibility and immunogenetics)Dragon dictation software along with smaller Lobbyistphrase technology. Despite my best ability to proofread, there is the potential that transcriptional errors may still occur from this process, and are completely unintentional.      Verlee MonteGray, Viren Lebeau E, NP 12/01/18 1251

## 2018-12-01 NOTE — Discharge Instructions (Signed)
It was very nice seeing you today in clinic. Thank you for entrusting me with your care.   Keep ear clean and dry. May use Tylenol and/or Ibuprofen as needed for pain/fever.  Please utilize the medications that we discussed. Your prescriptions have been called in to your pharmacy.   Make arrangements to follow up with your regular doctor in 1 week for re-evaluation if not improving. If your symptoms/condition worsens, please seek follow up care either here or in the ER. Please remember, our [place] providers are "right here with you" when you need [place].   Again, it was my pleasure to take care of you today. Thank you for choosing our clinic. I hope that you start to feel better quickly.   Honor Loh, MSN, APRN, FNP-C, CEN Advanced Practice Provider [facility]

## 2018-12-01 NOTE — ED Triage Notes (Signed)
Patient c/o left ear pain that started 3 days ago.  Patient also reports left sided facial pain and swelling that started this morning.

## 2018-12-23 ENCOUNTER — Ambulatory Visit
Admission: EM | Admit: 2018-12-23 | Discharge: 2018-12-23 | Disposition: A | Discharge status: Home / Self Care | Payer: Self-pay | Attending: Family | Admitting: Family

## 2018-12-23 ENCOUNTER — Other Ambulatory Visit: Payer: Self-pay

## 2018-12-23 ENCOUNTER — Encounter: Payer: Self-pay | Admitting: Emergency Medicine

## 2018-12-23 DIAGNOSIS — H60391 Other infective otitis externa, right ear: Secondary | ICD-10-CM

## 2018-12-23 DIAGNOSIS — H9201 Otalgia, right ear: Secondary | ICD-10-CM

## 2018-12-23 DIAGNOSIS — H66001 Acute suppurative otitis media without spontaneous rupture of ear drum, right ear: Secondary | ICD-10-CM

## 2018-12-23 DIAGNOSIS — H6982 Other specified disorders of Eustachian tube, left ear: Secondary | ICD-10-CM

## 2018-12-23 MED ORDER — TRAMADOL HCL 50 MG PO TABS: 50.0000 mg | ORAL_TABLET | Freq: Three times a day (TID) | ORAL | 0 refills | Status: AC | PRN

## 2018-12-23 MED ORDER — PREDNISONE 20 MG PO TABS: 40.0000 mg | ORAL_TABLET | Freq: Every day | ORAL | 0 refills | Status: AC

## 2018-12-23 MED ORDER — LEVOFLOXACIN 500 MG PO TABS: 500.0000 mg | ORAL_TABLET | Freq: Every day | ORAL | 0 refills | Status: AC

## 2018-12-23 NOTE — ED Provider Notes (Signed)
[facility]    CSN: [id number] Arrival date & time: 12/23/18  0818      History   Chief Complaint Chief Complaint  Patient presents with  . Otalgia    right    HPI Cassandra Pena is a 38 y.o. female.   38 year old female presents with right ear pain for the past 2 to 3 days. She denies any discharge. She was seen here on 12/01/2018- about 3 weeks ago- for left ear pain and inner ear and external otitis media. She was placed on Augmentin for 10 days and topical Corticosporin which has helped her left ear although she still feels like her left ear is clogged with decreased hearing. She no longer has any pain in her left ear. A few days ago- her right ear started having similar symptoms. She denies any nasal congestion, cough, sore throat, fever or GI symptoms. She has not had ear problems since she was a teenager. She has tried Ibuprofen and Corticosporin ear drops in the right ear now with no relief. Other chronic health issues include anxiety, depression and migraines but not currently on any medication and she has no health insurance.   The history is provided by the patient.    Past Medical History:  Diagnosis Date  . Anxiety   . Depression   . Endometriosis   . Migraines   . PTSD (post-traumatic stress disorder)     There are no active problems to display for this patient.   Past Surgical History:  Procedure Laterality Date  . CHOLECYSTECTOMY    . LEEP    . TUBAL LIGATION      OB History   No obstetric history on file.      Home Medications    Prior to Admission medications   Medication Sig Start Date End Date Taking? Authorizing Provider  ibuprofen (ADVIL,MOTRIN) 600 MG tablet Take 1 tablet (600 mg total) by mouth every 6 (six) hours as needed. 06/25/18   Melynda Ripple, MD  levofloxacin (LEVAQUIN) 500 MG tablet Take 1 tablet (500 mg total) by mouth daily for 10 days. 12/23/18 01/02/19  Katy Apo, NP  predniSONE (DELTASONE) 20 MG tablet  Take 2 tablets (40 mg total) by mouth daily for 5 days. 12/23/18 12/28/18  Katy Apo, NP  traMADol (ULTRAM) 50 MG tablet Take 1 tablet (50 mg total) by mouth every 8 (eight) hours as needed for up to 5 days for severe pain. 12/23/18 12/28/18  Katy Apo, NP  fluticasone (FLONASE) 50 MCG/ACT nasal spray Place 2 sprays into both nostrils daily. 06/25/18 12/01/18  Melynda Ripple, MD  sertraline (ZOLOFT) 50 MG tablet Take 1 tablet (50 mg total) by mouth every morning. Please begin by taking 25 mg by mouth every morning for the first 6 days and then increasing to 50 mg by mouth every morning 12/19/17 12/23/18  Darel Hong, MD  SUMAtriptan (IMITREX) 50 MG tablet 1 tablet at onset of migraine. Additional dose in 2 hours if persists. 11/16/17 12/23/18  Hinda Kehr, MD    Family History Family History  Problem Relation Age of Onset  . Breast cancer Paternal Aunt 71  . Breast cancer Paternal Grandmother   . Hypertension Mother   . Other Father        unknown medical history    Social History Social History   Tobacco Use  . Smoking status: Current Every Day Smoker    Packs/day: 1.00    Types: Cigarettes  .  Smokeless tobacco: Never Used  Substance Use Topics  . Alcohol use: No  . Drug use: No     Allergies   Vicodin [hydrocodone-acetaminophen]   Review of Systems Review of Systems  Constitutional: Positive for fatigue. Negative for activity change, appetite change, chills and fever.  HENT: Positive for ear pain and hearing loss (both ears). Negative for congestion, ear discharge, facial swelling, mouth sores, nosebleeds, postnasal drip, rhinorrhea, sinus pressure, sinus pain, sneezing, sore throat and trouble swallowing.   Eyes: Negative for pain, discharge, redness and itching.  Respiratory: Negative for cough, chest tightness, shortness of breath and wheezing.   Gastrointestinal: Negative for diarrhea, nausea and vomiting.  Musculoskeletal: Positive for neck pain (pain  traveling down right side of neck from ear). Negative for arthralgias, myalgias and neck stiffness.  Skin: Negative for color change, rash and wound.  Allergic/Immunologic: Negative for immunocompromised state.  Neurological: Positive for dizziness and headaches. Negative for tremors, seizures, syncope, weakness and numbness.  Hematological: Negative for adenopathy. Does not bruise/bleed easily.     Physical Exam Triage Vital Signs ED Triage Vitals [12/23/18 0826]  Enc Vitals Group     BP (!) 122/91     Pulse Rate 76     Resp 16     Temp 97.9 F (36.6 C)     Temp Source Oral     SpO2 99 %     Weight 200 lb (90.7 kg)     Height 5\' 8"  (1.727 m)     Head Circumference      Peak Flow      Pain Score 7     Pain Loc      Pain Edu?      Excl. in GC?    No data found.  Updated Vital Signs BP (!) 122/91 (BP Location: Left Arm)   Pulse 76   Temp 97.9 F (36.6 C) (Oral)   Resp 16   Ht 5\' 8"  (1.727 m)   Wt 200 lb (90.7 kg)   LMP 12/17/2018 (Exact Date)   SpO2 99%   BMI 30.41 kg/m   Visual Acuity Right Eye Distance:   Left Eye Distance:   Bilateral Distance:    Right Eye Near:   Left Eye Near:    Bilateral Near:     Physical Exam Vitals signs and nursing note reviewed.  Constitutional:      General: She is awake. She is in acute distress.     Appearance: She is well-developed and well-groomed. She is ill-appearing.     Comments: Patient is sitting in an exam chair and appears ill and  in pain and is crying due to the pain.   HENT:     Head: Normocephalic and atraumatic.     Right Ear: Decreased hearing noted. Swelling and tenderness present. No drainage. A middle ear effusion is present. There is no impacted cerumen. No foreign body. Tympanic membrane is injected, erythematous and bulging. Tympanic membrane is not perforated.     Left Ear: Ear canal and external ear normal. Decreased hearing noted. A middle ear effusion is present. Tympanic membrane is bulging.  Tympanic membrane is not injected or erythematous.     Nose: Nose normal. No congestion or rhinorrhea.     Right Sinus: No maxillary sinus tenderness or frontal sinus tenderness.     Left Sinus: No maxillary sinus tenderness or frontal sinus tenderness.     Mouth/Throat:     Lips: Pink.     Mouth: Mucous membranes  are moist.     Pharynx: Oropharynx is clear. Uvula midline. No pharyngeal swelling, oropharyngeal exudate, posterior oropharyngeal erythema or uvula swelling.  Eyes:     Extraocular Movements: Extraocular movements intact.     Comments: Both conjunctiva mildly injected due to crying  Neck:     Musculoskeletal: Normal range of motion and neck supple. Muscular tenderness present.     Comments: Tender along the right eustachian tube pathway in her neck Cardiovascular:     Rate and Rhythm: Normal rate and regular rhythm.     Heart sounds: Normal heart sounds. No murmur.  Pulmonary:     Effort: Pulmonary effort is normal. No respiratory distress.     Breath sounds: Normal breath sounds and air entry. No decreased breath sounds, wheezing or rhonchi.  Musculoskeletal: Normal range of motion.  Skin:    General: Skin is warm and dry.     Capillary Refill: Capillary refill takes less than 2 seconds.     Findings: No rash.  Neurological:     General: No focal deficit present.     Mental Status: She is alert and oriented to person, place, and time.  Psychiatric:        Attention and Perception: Attention normal.        Mood and Affect: Mood is anxious. Affect is tearful.        Behavior: Behavior normal. Behavior is cooperative.        Thought Content: Thought content normal.        Cognition and Memory: Cognition normal.        Judgment: Judgment normal.      UC Treatments / Results  Labs (all labs ordered are listed, but only abnormal results are displayed) Labs Reviewed - No data to display  EKG   Radiology No results found.  Procedures Procedures (including  critical care time)  Medications Ordered in UC Medications - No data to display  Initial Impression / Assessment and Plan / UC Course  I have reviewed the triage vital signs and the nursing notes.  Pertinent labs & imaging results that were available during my care of the patient were reviewed by me and considered in my medical decision making (see chart for details).    Reviewed findings with patient- discussed that she has a inner ear infection of her right ear as well as a ear canal infection. Her left ear infection has cleared but she still has residual fluid present behind her TM. Discussed that since she just finished Augmentin and she does not have any insurance coverage, will use GoodRx and will start oral Levaquin 500mg  once daily for 10 days. Discussed topical antibiotic and steroid ear drops but patient unable to afford- will trial oral Prednisone 40mg  daily for 5 days then stop. Avoid water in both ears. May continue Ibuprofen 600mg  every 8 hours as needed and alternate with Tramadol 50mg  every 8 hours as needed for severe pain. Encouraged to use OTC Flonase 2 sprays each nostril twice a day for the next week to help with fluid behind her TM. Discussed possible need to see ENT for further evaluation if symptoms do not clear up or if they recur. Note written for work for last night and today. Recommend follow-up with [facility] in 3 to 5 days if not improving.   Final Clinical Impressions(s) / UC Diagnoses   Final diagnoses:  Acute suppurative otitis media of right ear without spontaneous rupture of tympanic membrane, recurrence not specified  Otalgia  of right ear  Infective otitis externa of right ear  Acute dysfunction of left eustachian tube     Discharge Instructions     Recommend start Levaquin 500mg  tablet once daily for 10 days. May take Prednisone 40mg  daily for 5 days then stop. Continue Ibuprofen 600mg  every 8 hours as needed. May take Tramadol 50mg  every 8 hours as  needed for severe pain. Also use OTC Flonase 2 sprays each nostril twice a day to help with ear pressure. If pain and infection is not improving within 3 days, call [facility] to schedule appointment for recheck.     ED Prescriptions    Medication Sig Dispense Auth. Provider   levofloxacin (LEVAQUIN) 500 MG tablet Take 1 tablet (500 mg total) by mouth daily for 10 days. 10 tablet Sudie Grumbling, NP   predniSONE (DELTASONE) 20 MG tablet Take 2 tablets (40 mg total) by mouth daily for 5 days. 10 tablet Sudie Grumbling, NP   traMADol (ULTRAM) 50 MG tablet Take 1 tablet (50 mg total) by mouth every 8 (eight) hours as needed for up to 5 days for severe pain. 15 tablet Sudie Grumbling, NP     Controlled Substance Prescriptions [place] Controlled Substance Registry consulted? Yes, I have consulted the  Controlled Substances Registry for this patient, and feel the risk/benefit ratio today is favorable for proceeding with this prescription for a controlled substance. She does not have any active RX.    Sudie Grumbling, NP 12/23/18 [phone]

## 2018-12-23 NOTE — Discharge Instructions (Addendum)
Recommend start Levaquin 500mg  tablet once daily for 10 days. May take Prednisone 40mg  daily for 5 days then stop. Continue Ibuprofen 600mg  every 8 hours as needed. May take Tramadol 50mg  every 8 hours as needed for severe pain. Also use OTC Flonase 2 sprays each nostril twice a day to help with ear pressure. If pain and infection is not improving within 3 days, call [facility] to schedule appointment for recheck.

## 2018-12-23 NOTE — ED Triage Notes (Signed)
Patient c/o right ear pain that started 2-3 days ago.  Patient reports fullness in her left ear since treated for ear infection in left ear on 12/01/18. Patient denies fevers.

## 2019-10-10 ENCOUNTER — Other Ambulatory Visit: Payer: Self-pay

## 2019-10-10 ENCOUNTER — Ambulatory Visit
Admission: EM | Admit: 2019-10-10 | Discharge: 2019-10-10 | Disposition: A | Discharge status: Home / Self Care | Payer: Self-pay | Attending: Family Medicine | Admitting: Family Medicine

## 2019-10-10 ENCOUNTER — Encounter: Payer: Self-pay | Admitting: Emergency Medicine

## 2019-10-10 DIAGNOSIS — R519 Headache, unspecified: Secondary | ICD-10-CM

## 2019-10-10 DIAGNOSIS — R110 Nausea: Secondary | ICD-10-CM

## 2019-10-10 MED ORDER — ONDANSETRON HCL 4 MG PO TABS: 4.0000 mg | ORAL_TABLET | Freq: Three times a day (TID) | ORAL | 0 refills | Status: DC | PRN

## 2019-10-10 MED ORDER — SUMATRIPTAN SUCCINATE 50 MG PO TABS: 50.0000 mg | ORAL_TABLET | Freq: Once | ORAL | 0 refills | Status: AC

## 2019-10-10 MED ORDER — NAPROXEN 500 MG PO TABS: 500.0000 mg | ORAL_TABLET | Freq: Two times a day (BID) | ORAL | 0 refills | Status: DC | PRN

## 2019-10-10 NOTE — ED Provider Notes (Signed)
[facility]    CSN: [id number] Arrival date & time: 10/10/19  1236  History   Chief Complaint Chief Complaint  Patient presents with  . Insect Bite  . Headache  . Nausea   HPI   39 year old female presents with the above complaints.  Patient reports that on Friday she was stung by bees.  She had 3 different areas.  The seem to have resolved.  Resolved following Benadryl use.  Patient reports that afterwards she developed nausea and has been having frequent headaches.  She has a history of migraine but states that this feels different.  Headache located in the right frontal region.  Denies photophobia.  Patient has had no improvement with over-the-counter medication.  She is unsure if is related to her bee stings.  No vomiting.  No other associated symptoms.    Past Medical History:  Diagnosis Date  . Anxiety   . Depression   . Endometriosis   . Migraines   . PTSD (post-traumatic stress disorder)    Past Surgical History:  Procedure Laterality Date  . CHOLECYSTECTOMY    . LEEP    . TUBAL LIGATION     OB History   No obstetric history on file.    Home Medications    Prior to Admission medications   Medication Sig Start Date End Date Taking? Authorizing Provider  naproxen (NAPROSYN) 500 MG tablet Take 1 tablet (500 mg total) by mouth 2 (two) times daily as needed for headache. 10/10/19   Tommie Sams, DO  ondansetron (ZOFRAN) 4 MG tablet Take 1 tablet (4 mg total) by mouth every 8 (eight) hours as needed for nausea or vomiting. 10/10/19   Tommie Sams, DO  SUMAtriptan (IMITREX) 50 MG tablet Take 1 tablet (50 mg total) by mouth once for 1 dose. May repeat in 2 hours if headache persists or recurs. 10/10/19 10/10/19  Tommie Sams, DO  fluticasone (FLONASE) 50 MCG/ACT nasal spray Place 2 sprays into both nostrils daily. 06/25/18 12/01/18  Domenick Gong, MD  sertraline (ZOLOFT) 50 MG tablet Take 1 tablet (50 mg total) by mouth every morning. Please begin by taking  25 mg by mouth every morning for the first 6 days and then increasing to 50 mg by mouth every morning 12/19/17 12/23/18  Merrily Brittle, MD    Family History Family History  Problem Relation Age of Onset  . Breast cancer Paternal Aunt 30  . Breast cancer Paternal Grandmother   . Hypertension Mother   . Other Father        unknown medical history    Social History Social History   Tobacco Use  . Smoking status: Current Every Day Smoker    Packs/day: 1.00    Types: Cigarettes  . Smokeless tobacco: Never Used  Vaping Use  . Vaping Use: Never used  Substance Use Topics  . Alcohol use: No  . Drug use: No     Allergies   Vicodin [hydrocodone-acetaminophen]   Review of Systems Review of Systems  Gastrointestinal: Positive for nausea.  Neurological: Positive for headaches.   Physical Exam Triage Vital Signs ED Triage Vitals  Enc Vitals Group     BP 10/10/19 1244 133/89     Pulse Rate 10/10/19 1244 70     Resp 10/10/19 1244 18     Temp 10/10/19 1244 98.2 F (36.8 C)     Temp Source 10/10/19 1244 Oral     SpO2 10/10/19 1244 97 %  Weight 10/10/19 1243 220 lb (99.8 kg)     Height 10/10/19 1243 5\' 8"  (1.727 m)     Head Circumference --      Peak Flow --      Pain Score 10/10/19 1242 6     Pain Loc --      Pain Edu? --      Excl. in GC? --    Updated Vital Signs BP 133/89 (BP Location: Right Arm)   Pulse 70   Temp 98.2 F (36.8 C) (Oral)   Resp 18   Ht 5\' 8"  (1.727 m)   Wt 99.8 kg   LMP 10/02/2019   SpO2 97%   BMI 33.45 kg/m   Visual Acuity Right Eye Distance:   Left Eye Distance:   Bilateral Distance:    Right Eye Near:   Left Eye Near:    Bilateral Near:     Physical Exam Constitutional:      General: She is not in acute distress.    Appearance: Normal appearance. She is not ill-appearing.  HENT:     Head: Normocephalic and atraumatic.  Eyes:     General:        Right eye: No discharge.        Left eye: No discharge.      Conjunctiva/sclera: Conjunctivae normal.  Cardiovascular:     Rate and Rhythm: Normal rate and regular rhythm.  Pulmonary:     Effort: Pulmonary effort is normal.     Breath sounds: Normal breath sounds.  Skin:    Findings: No rash.  Neurological:     General: No focal deficit present.     Mental Status: She is alert.    UC Treatments / Results  Labs (all labs ordered are listed, but only abnormal results are displayed) Labs Reviewed - No data to display  EKG   Radiology No results found.  Procedures Procedures (including critical care time)  Medications Ordered in UC Medications - No data to display  Initial Impression / Assessment and Plan / UC Course  I have reviewed the triage vital signs and the nursing notes.  Pertinent labs & imaging results that were available during my care of the patient were reviewed by me and considered in my medical decision making (see chart for details).    39 year old female presents with nausea and acute headache. Exam unremarkable. Treating with Zofran, Imitrex, naproxen.  Final Clinical Impressions(s) / UC Diagnoses   Final diagnoses:  Nausea without vomiting  Acute nonintractable headache, unspecified headache type     Discharge Instructions     Rest.  Medication as prescribed.  Take care  Dr. 10/04/2019    ED Prescriptions    Medication Sig Dispense Auth. Provider   ondansetron (ZOFRAN) 4 MG tablet Take 1 tablet (4 mg total) by mouth every 8 (eight) hours as needed for nausea or vomiting. 20 tablet Jacklin Zwick G, DO   SUMAtriptan (IMITREX) 50 MG tablet Take 1 tablet (50 mg total) by mouth once for 1 dose. May repeat in 2 hours if headache persists or recurs. 10 tablet Olusegun Gerstenberger G, DO   naproxen (NAPROSYN) 500 MG tablet Take 1 tablet (500 mg total) by mouth 2 (two) times daily as needed for headache. 30 tablet 05-17-2003, DO     PDMP not reviewed this encounter.   06[birth year]1983, Tommie Sams 10/10/19 1538

## 2019-10-10 NOTE — Discharge Instructions (Signed)
Rest  Medication as prescribed.  Take care  Dr. Lauriel Helin  

## 2019-10-10 NOTE — ED Triage Notes (Signed)
Patient states on Friday she was stung by bees on her chest in 3 different areas. She states since she was stung she has been nauseated and having headaches.

## 2020-04-23 ENCOUNTER — Other Ambulatory Visit: Payer: Self-pay

## 2020-04-23 DIAGNOSIS — Z20822 Contact with and (suspected) exposure to covid-19: Secondary | ICD-10-CM

## 2020-04-25 LAB — NOVEL CORONAVIRUS, NAA: SARS-CoV-2, NAA: DETECTED — AB

## 2020-04-25 LAB — SARS-COV-2, NAA 2 DAY TAT

## 2020-06-09 ENCOUNTER — Emergency Department: Payer: No Typology Code available for payment source

## 2020-06-09 ENCOUNTER — Encounter: Payer: Self-pay | Admitting: Emergency Medicine

## 2020-06-09 ENCOUNTER — Emergency Department
Admission: EM | Admit: 2020-06-09 | Discharge: 2020-06-09 | Disposition: A | Discharge status: Home / Self Care | Payer: No Typology Code available for payment source | Attending: Emergency Medicine | Admitting: Emergency Medicine

## 2020-06-09 ENCOUNTER — Other Ambulatory Visit: Payer: Self-pay

## 2020-06-09 DIAGNOSIS — S161XXA Strain of muscle, fascia and tendon at neck level, initial encounter: Secondary | ICD-10-CM | POA: Diagnosis not present

## 2020-06-09 DIAGNOSIS — R519 Headache, unspecified: Secondary | ICD-10-CM | POA: Diagnosis not present

## 2020-06-09 DIAGNOSIS — Y92481 Parking lot as the place of occurrence of the external cause: Secondary | ICD-10-CM | POA: Insufficient documentation

## 2020-06-09 DIAGNOSIS — S199XXA Unspecified injury of neck, initial encounter: Secondary | ICD-10-CM | POA: Diagnosis present

## 2020-06-09 DIAGNOSIS — S0991XA Unspecified injury of ear, initial encounter: Secondary | ICD-10-CM | POA: Insufficient documentation

## 2020-06-09 DIAGNOSIS — F17210 Nicotine dependence, cigarettes, uncomplicated: Secondary | ICD-10-CM | POA: Diagnosis not present

## 2020-06-09 MED ORDER — ORPHENADRINE CITRATE 30 MG/ML IJ SOLN
60.0000 mg | Freq: Two times a day (BID) | INTRAMUSCULAR | Status: DC
  Administered 2020-06-09: 60 mg via INTRAMUSCULAR
  Filled 2020-06-09: qty 2

## 2020-06-09 MED ORDER — ORPHENADRINE CITRATE ER 100 MG PO TB12: 100.0000 mg | ORAL_TABLET | Freq: Two times a day (BID) | ORAL | 0 refills | Status: DC

## 2020-06-09 MED ORDER — KETOROLAC TROMETHAMINE 60 MG/2ML IM SOLN
60.0000 mg | Freq: Once | INTRAMUSCULAR | Status: AC
  Administered 2020-06-09: 60 mg via INTRAMUSCULAR
  Filled 2020-06-09: qty 2

## 2020-06-09 MED ORDER — KETOROLAC TROMETHAMINE 10 MG PO TABS: 10.0000 mg | ORAL_TABLET | Freq: Four times a day (QID) | ORAL | 0 refills | Status: DC | PRN
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
## 2020-06-09 NOTE — Discharge Instructions (Signed)
No acute findings on x-ray of the l cervical spine.  Follow discharge care instruction take medication as directed.
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
## 2020-10-01 ENCOUNTER — Emergency Department
Admission: EM | Admit: 2020-10-01 | Discharge: 2020-10-01 | Disposition: A | Discharge status: Home / Self Care | Payer: Self-pay | Attending: Emergency Medicine | Admitting: Emergency Medicine

## 2020-10-01 ENCOUNTER — Other Ambulatory Visit: Payer: Self-pay

## 2020-10-01 ENCOUNTER — Emergency Department: Payer: Self-pay

## 2020-10-01 DIAGNOSIS — S9031XA Contusion of right foot, initial encounter: Secondary | ICD-10-CM | POA: Insufficient documentation

## 2020-10-01 DIAGNOSIS — F17210 Nicotine dependence, cigarettes, uncomplicated: Secondary | ICD-10-CM | POA: Insufficient documentation

## 2020-10-01 DIAGNOSIS — T1490XA Injury, unspecified, initial encounter: Secondary | ICD-10-CM

## 2020-10-01 DIAGNOSIS — Y9259 Other trade areas as the place of occurrence of the external cause: Secondary | ICD-10-CM | POA: Insufficient documentation

## 2020-10-01 DIAGNOSIS — W208XXA Other cause of strike by thrown, projected or falling object, initial encounter: Secondary | ICD-10-CM | POA: Insufficient documentation

## 2020-10-01 MED ORDER — OXYCODONE-ACETAMINOPHEN 5-325 MG PO TABS
1.0000 | ORAL_TABLET | ORAL | Status: DC | PRN
  Administered 2020-10-01: 1 via ORAL
  Filled 2020-10-01: qty 1

## 2020-10-01 MED ORDER — HYDROCODONE-ACETAMINOPHEN 5-325 MG PO TABS: 1.0000 | ORAL_TABLET | Freq: Three times a day (TID) | ORAL | 0 refills | Status: DC | PRN

## 2020-10-01 NOTE — ED Provider Notes (Signed)
[facility] Emergency Department Provider Note ____________________________________________  Time seen: 1628  I have reviewed the triage vital signs and the nursing notes.  HISTORY  Chief Complaint  Foot Injury   HPI Cassandra Pena is a 40 y.o. female presents to the ED accompanied by her daughter, for evaluation of acute right foot pain.  Patient reports she was at a local junkyard, picking up a new car door for her vehicle, when she accidentally dropped the detached door on her foot.  She presents with significant swelling and bruising to top of the right foot.  He notes a small laceration to the top of the foot without any active bleeding at this time.  She denies any other injury at this time.  Past Medical History:  Diagnosis Date   Anxiety    Depression    Endometriosis    Migraines    PTSD (post-traumatic stress disorder)     There are no problems to display for this patient.   Past Surgical History:  Procedure Laterality Date   CHOLECYSTECTOMY     LEEP     TUBAL LIGATION      Prior to Admission medications   Medication Sig Start Date End Date Taking? Authorizing Provider  HYDROcodone-acetaminophen (NORCO) 5-325 MG tablet Take 1 tablet by mouth 3 (three) times daily as needed. 10/01/20  Yes Enna Warwick, Charlesetta Ivory, PA-C  ondansetron (ZOFRAN) 4 MG tablet Take 1 tablet (4 mg total) by mouth every 8 (eight) hours as needed for nausea or vomiting. 10/10/19   Tommie Sams, DO  SUMAtriptan (IMITREX) 50 MG tablet Take 1 tablet (50 mg total) by mouth once for 1 dose. May repeat in 2 hours if headache persists or recurs. 10/10/19 10/10/19  Tommie Sams, DO  fluticasone (FLONASE) 50 MCG/ACT nasal spray Place 2 sprays into both nostrils daily. 06/25/18 12/01/18  Domenick Gong, MD  sertraline (ZOLOFT) 50 MG tablet Take 1 tablet (50 mg total) by mouth every morning. Please begin by taking 25 mg by mouth every morning for the first 6 days and then increasing  to 50 mg by mouth every morning 12/19/17 12/23/18  Merrily Brittle, MD    Allergies Vicodin [hydrocodone-acetaminophen]  Family History  Problem Relation Age of Onset   Breast cancer Paternal Aunt 14   Breast cancer Paternal Grandmother    Hypertension Mother    Other Father        unknown medical history    Social History Social History   Tobacco Use   Smoking status: Every Day    Packs/day: 1.00    Pack years: 0.00    Types: Cigarettes   Smokeless tobacco: Never  Vaping Use   Vaping Use: Never used  Substance Use Topics   Alcohol use: No   Drug use: No    Review of Systems  Constitutional: Negative for fever. Cardiovascular: Negative for chest pain. Respiratory: Negative for shortness of breath. Gastrointestinal: Negative for abdominal pain, vomiting and diarrhea. Genitourinary: Negative for dysuria. Musculoskeletal: Negative for back pain. Right foot pain as above Skin: Negative for rash. Neurological: Negative for headaches, focal weakness or numbness. ____________________________________________  PHYSICAL EXAM:  VITAL SIGNS: ED Triage Vitals  Enc Vitals Group     BP 10/01/20 1549 117/83     Pulse Rate 10/01/20 1549 73     Resp 10/01/20 1549 18     Temp 10/01/20 1549 98.3 F (36.8 C)     Temp Source 10/01/20 1549 Oral  SpO2 10/01/20 1549 99 %     Weight 10/01/20 1550 220 lb (99.8 kg)     Height 10/01/20 1550 5\' 8"  (1.727 m)     Head Circumference --      Peak Flow --      Pain Score 10/01/20 1549 10     Pain Loc --      Pain Edu? --      Excl. in GC? --     Constitutional: Alert and oriented. Well appearing and in no distress. Head: Normocephalic and atraumatic. Eyes: Conjunctivae are normal. Normal extraocular movements Cardiovascular: Normal rate, regular rhythm. Normal distal pulses. Respiratory: Normal respiratory effort. No wheezes/rales/rhonchi. Gastrointestinal: Soft and nontender. No distention. Musculoskeletal: Nontender with normal  range of motion in all extremities.  Right foot with obvious soft tissue swelling ecchymosis to the dorsum of the foot Neurologic:  Normal speech and language. No gross focal neurologic deficits are appreciated. Skin:  Skin is warm, dry and intact. No rash noted. ____________________________________________   RADIOLOGY  DG Right Foot  IMPRESSION: No acute osseous abnormality  I, Mujahid Jalomo V Bacon-Jupiter Kabir, personally discussed these images and results by phone with the on-call radiologist and used this discussion as part of my medical decision making.  ____________________________________________  PROCEDURES  Post Op shoe Ice pack Oxycodone-APAP 5-325 mg PO   Procedures ____________________________________________   INITIAL IMPRESSION / ASSESSMENT AND PLAN / ED COURSE  As part of my medical decision making, I reviewed the following data within the electronic MEDICAL RECORD NUMBER Radiograph reviewed NAD, Notes from prior ED visits, and [place] Controlled Substance Database    DDX: foot contusion, foot sprain, foot fracture, laceration  Patient presents to the ED for evaluation of acute right foot pain.  Patient apparently dropped a car door on her foot at a local junkyard.  She presents with obvious swelling and ecchymosis dorsally.  A small wound is noted with scabbed blood noted.  She is placed in a postop shoe, and referred to podiatry for ongoing management of esophageal injury.  A small prescription for hydrocodone is provided for benefit.  She will rest with the foot elevated and apply ice to reduce swelling.  Work note is provided as requested.    Cassandra Pena was evaluated in Emergency Department on 10/01/2020 for the symptoms described in the history of present illness. She was evaluated in the context of the global COVID-19 pandemic, which necessitated consideration that the patient might be at risk for infection with the SARS-CoV-2 virus that causes COVID-19. Institutional  protocols and algorithms that pertain to the evaluation of patients at risk for COVID-19 are in a state of rapid change based on information released by regulatory bodies including the CDC and federal and state organizations. These policies and algorithms were followed during the patient's care in the ED.  I reviewed the patient's prescription history over the last 12 months in the multi-state controlled substances database(s) that includes [place], [place], [place], [place], [place], [place], [place], [place], [place], [place], [place], [place], [place], and [place].  Results were notable for no current RX.  ____________________________________________  FINAL CLINICAL IMPRESSION(S) / ED DIAGNOSES  Final diagnoses:  Injury  Contusion of right foot, initial encounter      [place], PA-C 10/01/20 10/03/20, MD 10/02/20 [phone]

## 2020-10-01 NOTE — Discharge Instructions (Addendum)
You are being treated for a foot contusion and a hematoma. Wear the post-op shoe to get around. Rest with the foot elevated and apply ice to reduce swelling. Take OTC ibuprofen for non-drowsy pain relief. Follow-up with Podiatry for ongoing care.
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
## 2020-10-01 NOTE — ED Notes (Signed)
ED Provider at bedside. 

## 2020-10-01 NOTE — ED Triage Notes (Signed)
Pt to ED POV for injury to right foot while picking up a door, dropped it on her foot.  Significant swelling noted to top of right foot.  Pt requesting pain meds

## 2020-10-01 NOTE — ED Notes (Signed)
Post op shoe applied by ED NT. Patient provided discharge instructions by Kristian Covey, RN.

## 2020-10-06 ENCOUNTER — Emergency Department: Admission: EM | Admit: 2020-10-06 | Discharge: 2020-10-06 | Discharge status: Against Medical Advice | Payer: Self-pay

## 2020-10-07 ENCOUNTER — Other Ambulatory Visit: Payer: Self-pay

## 2020-10-07 ENCOUNTER — Ambulatory Visit
Admission: EM | Admit: 2020-10-07 | Discharge: 2020-10-07 | Disposition: A | Discharge status: Home / Self Care | Payer: Self-pay

## 2020-10-07 DIAGNOSIS — L03115 Cellulitis of right lower limb: Secondary | ICD-10-CM

## 2020-10-07 MED ORDER — SULFAMETHOXAZOLE-TRIMETHOPRIM 800-160 MG PO TABS: 1.0000 | ORAL_TABLET | Freq: Two times a day (BID) | ORAL | 0 refills | Status: AC

## 2020-10-07 NOTE — ED Triage Notes (Signed)
Pt c/o heavy object falling on her right foot last week. She did seek evaluation and had xrays and was told nothing was broken. She is wearing a post-op shoe for support. Pt is asking about clearance to return to work. Pt has significant bruising to the foot and a small cut on the top.

## 2020-10-07 NOTE — Discharge Instructions (Addendum)
Take the Bactrim twice daily with a full glass of water for 7 days for treatment of your soft tissue skin infection.  Keep your right foot elevated is much as possible to help minimize swelling and aid in healing.  Only bear weight when necessary.  If you must bear weight wear your postop shoe to provide support and place a Band-Aid over the wound to prevent further irritation.  If you develop any increasing redness, red streaks going up your foot, swelling lymph nodes in your groin, or fever you need to go to the emergency department for evaluation.

## 2020-10-07 NOTE — ED Provider Notes (Signed)
[facility]    CSN: [id number] Arrival date & time: 10/07/20  1117      History   Chief Complaint Chief Complaint  Patient presents with   Foot Injury    right    HPI Cassandra Pena is a 40 y.o. female.   HPI  40 year old female here for evaluation of right foot injury.  Patient is here return to work following an injury to her right foot.  She was evaluated in the emergency department on 10/01/2020 after dropping a car door on her foot while wearing sandals.  X-rays at that time did not demonstrate any fracture.  She attempted to go back to work yesterday and her Publishing copy told her she needed to be evaluated due to the swelling and bruising.  Patient also has a small laceration on the top of her foot that occurred when she dropped a car door that is red, hot, and draining.  Patient denies fever or numbness and tingling in her toes.  Past Medical History:  Diagnosis Date   Anxiety    Depression    Endometriosis    Migraines    PTSD (post-traumatic stress disorder)     There are no problems to display for this patient.   Past Surgical History:  Procedure Laterality Date   CHOLECYSTECTOMY     LEEP     TUBAL LIGATION      OB History   No obstetric history on file.      Home Medications    Prior to Admission medications   Medication Sig Start Date End Date Taking? Authorizing Provider  HYDROcodone-acetaminophen (NORCO) 5-325 MG tablet Take 1 tablet by mouth 3 (three) times daily as needed. 10/01/20  Yes Menshew, Charlesetta Ivory, PA-C  sertraline (ZOLOFT) 100 MG tablet Take 150 mg by mouth daily. 05/27/20  Yes [provider]  sulfamethoxazole-trimethoprim (BACTRIM DS) 800-160 MG tablet Take 1 tablet by mouth 2 (two) times daily for 7 days. 10/07/20 10/14/20 Yes Becky Augusta, NP  SUMAtriptan (IMITREX) 50 MG tablet Take 1 tablet (50 mg total) by mouth once for 1 dose. May repeat in 2 hours if headache persists or recurs. 10/10/19 10/07/20 Yes  Cook, Jayce G, DO  ondansetron (ZOFRAN) 4 MG tablet Take 1 tablet (4 mg total) by mouth every 8 (eight) hours as needed for nausea or vomiting. 10/10/19   Everlene Other G, DO  fluticasone (FLONASE) 50 MCG/ACT nasal spray Place 2 sprays into both nostrils daily. 06/25/18 12/01/18  Domenick Gong, MD    Family History Family History  Problem Relation Age of Onset   Breast cancer Paternal Aunt 31   Breast cancer Paternal Grandmother    Hypertension Mother    Other Father        unknown medical history    Social History Social History   Tobacco Use   Smoking status: Every Day    Packs/day: 1.00    Pack years: 0.00    Types: Cigarettes   Smokeless tobacco: Never  Vaping Use   Vaping Use: Never used  Substance Use Topics   Alcohol use: No   Drug use: No     Allergies   Vicodin [hydrocodone-acetaminophen]   Review of Systems Review of Systems  Constitutional:  Negative for fever.  Skin:  Positive for color change and wound.  Neurological:  Negative for weakness and numbness.  Hematological: Negative.   Psychiatric/Behavioral: Negative.      Physical Exam Triage Vital Signs ED Triage Vitals  Enc Vitals Group     BP 10/07/20 1145 123/86     Pulse Rate 10/07/20 1145 68     Resp 10/07/20 1145 18     Temp 10/07/20 1145 98.4 F (36.9 C)     Temp Source 10/07/20 1145 Oral     SpO2 10/07/20 1145 100 %     Weight 10/07/20 1142 220 lb (99.8 kg)     Height 10/07/20 1142 5\' 8"  (1.727 m)     Head Circumference --      Peak Flow --      Pain Score 10/07/20 1141 6     Pain Loc --      Pain Edu? --      Excl. in [place]? --    No data found.  Updated Vital Signs BP 123/86 (BP Location: Left Arm)   Pulse 68   Temp 98.4 F (36.9 C) (Oral)   Resp 18   Ht 5\' 8"  (1.727 m)   Wt 220 lb (99.8 kg)   LMP 09/08/2020 (Approximate)   SpO2 100%   BMI 33.45 kg/m   Visual Acuity Right Eye Distance:   Left Eye Distance:   Bilateral Distance:    Right Eye Near:   Left Eye Near:     Bilateral Near:     Physical Exam Vitals and nursing note reviewed.  Constitutional:      General: She is not in acute distress.    Appearance: Normal appearance. She is not ill-appearing.  Musculoskeletal:        General: Swelling, tenderness and signs of injury present. No deformity. Normal range of motion.  Skin:    Findings: Bruising and erythema present.  Neurological:     General: No focal deficit present.     Mental Status: She is alert and oriented to person, place, and time.     Sensory: No sensory deficit.     Motor: No weakness.  Psychiatric:        Mood and Affect: Mood normal.        Behavior: Behavior normal.        Thought Content: Thought content normal.        Judgment: Judgment normal.     UC Treatments / Results  Labs (all labs ordered are listed, but only abnormal results are displayed) Labs Reviewed - No data to display  EKG   Radiology No results found.  Procedures Procedures (including critical care time)  Medications Ordered in UC Medications - No data to display  Initial Impression / Assessment and Plan / UC Course  I have reviewed the triage vital signs and the nursing notes.  Pertinent labs & imaging results that were available during my care of the patient were reviewed by me and considered in my medical decision making (see chart for details).  Patient is a very pleasant 40 year old female here for reevaluation of right foot injury that occurred 6 days ago.  She was evaluated initially in the ER and she is here today because her nurse manager wants her to be reevaluated and cleared to return to work.  Patient's physical exam reveals a right foot that is in normal anatomical alignment with mild swelling and ecchymosis to the dorsum of the foot from her toes to the inferior aspect of her lateral malleolus.  Patient has full sensation and range of motion of her toes.  She does have limited plantar flexion but that is secondary to a previous  injury and she does have good  dorsiflexion.  Patient has no tenderness to her toes, no tenderness to either malleoli, no tenderness to her arch, no tenderness to her calcaneus, and no tenderness to her Achilles.  She does have mild tenderness over the lateral midfoot.  The laceration is on the medial midfoot and is approximately 1 cm with dried serosanguineous drainage in the wound bed.  There is surrounding erythema and warmth approximately size of a quarter surrounding the wound.  There is concern for developing cellulitis.  We will place patient on Bactrim twice daily for 7 days for the cellulitis, have her keep her leg elevated is much as possible to help minimize the swelling, and will give a work note for the next 3 days.  Patient does have hardware in her right ankle from a previous injury which is a secondary reason for antibiotic therapy.   Final Clinical Impressions(s) / UC Diagnoses   Final diagnoses:  Cellulitis of right foot without toes     Discharge Instructions      Take the Bactrim twice daily with a full glass of water for 7 days for treatment of your soft tissue skin infection.  Keep your right foot elevated is much as possible to help minimize swelling and aid in healing.  Only bear weight when necessary.  If you must bear weight wear your postop shoe to provide support and place a Band-Aid over the wound to prevent further irritation.  If you develop any increasing redness, red streaks going up your foot, swelling lymph nodes in your groin, or fever you need to go to the emergency department for evaluation.     ED Prescriptions     Medication Sig Dispense Auth. Provider   sulfamethoxazole-trimethoprim (BACTRIM DS) 800-160 MG tablet Take 1 tablet by mouth 2 (two) times daily for 7 days. 14 tablet Becky Augusta, NP      PDMP not reviewed this encounter.   Becky Augusta, NP 10/07/20 1228

## 2021-01-13 ENCOUNTER — Other Ambulatory Visit: Payer: Self-pay

## 2021-02-27 ENCOUNTER — Other Ambulatory Visit: Payer: Self-pay

## 2021-02-27 ENCOUNTER — Encounter: Payer: Self-pay | Admitting: Emergency Medicine

## 2021-02-27 ENCOUNTER — Ambulatory Visit
Admission: EM | Admit: 2021-02-27 | Discharge: 2021-02-27 | Disposition: A | Discharge status: Home / Self Care | Payer: Self-pay | Attending: Family | Admitting: Family

## 2021-02-27 DIAGNOSIS — R1084 Generalized abdominal pain: Secondary | ICD-10-CM

## 2021-02-27 DIAGNOSIS — K589 Irritable bowel syndrome without diarrhea: Secondary | ICD-10-CM

## 2021-02-27 DIAGNOSIS — R197 Diarrhea, unspecified: Secondary | ICD-10-CM

## 2021-02-27 MED ORDER — DICYCLOMINE HCL 20 MG PO TABS: 20.0000 mg | ORAL_TABLET | Freq: Two times a day (BID) | ORAL | 0 refills | Status: DC

## 2021-02-27 NOTE — Discharge Instructions (Addendum)
Recommend start Bentyl 20mg  twice a day as needed for abdominal pain/cramping and diarrhea. Continue to push fluids. May slowly start bland foods and advance diet as tolerated. Continue to monitor symptoms. If pain gets worse, unable to keep down any fluids or fever of 102 or higher, go to the ER ASAP. Otherwise, follow-up in 2 to 3 days if not resolving.

## 2021-02-27 NOTE — ED Provider Notes (Signed)
[facility]    CSN: [id number] Arrival date & time: 02/27/21  1322      History   Chief Complaint Chief Complaint  Patient presents with   Abdominal Pain   Emesis    HPI Cassandra Pena is a 40 y.o. female.   40 year old female presents with left mid to central abdominal pain that started yesterday morning. Went out to dinner with husband and friends 2 nights ago to celebrate her birthday. No unusual food but did consume 3 alcohol drinks (mimosa's) which she rarely drinks. The next morning started having left upper intermittent stabbing abdominal pain. Also vomited and has watery diarrhea. Had low grade fever last night. No nausea and no vomiting today but diarrhea continues and abdominal pain continues to be intermittent but slightly better. Has been drinking water but no solid foods. No dysuria, hematuria or pelvic pain. LMP last week. No previous similar episodes. Gallbladder previous removed. Has taken Gas-X and Ibuprofen with minimal relief. No other chronic health issues except migraine headaches. Takes Imitrex prn.   The history is provided by the patient.   Past Medical History:  Diagnosis Date   Anxiety    Depression    Endometriosis    Migraines    PTSD (post-traumatic stress disorder)     There are no problems to display for this patient.   Past Surgical History:  Procedure Laterality Date   CHOLECYSTECTOMY     LEEP     TUBAL LIGATION      OB History   No obstetric history on file.      Home Medications    Prior to Admission medications   Medication Sig Start Date End Date Taking? Authorizing Provider  dicyclomine (BENTYL) 20 MG tablet Take 1 tablet (20 mg total) by mouth 2 (two) times daily. 02/27/21  Yes Pervis Macintyre, Nicholes Stairs, NP  SUMAtriptan (IMITREX) 50 MG tablet Take 1 tablet (50 mg total) by mouth once for 1 dose. May repeat in 2 hours if headache persists or recurs. 10/10/19 10/07/20  Coral Spikes, DO  fluticasone (FLONASE) 50 MCG/ACT  nasal spray Place 2 sprays into both nostrils daily. 06/25/18 12/01/18  Melynda Ripple, MD    Family History Family History  Problem Relation Age of Onset   Breast cancer Paternal Aunt 18   Breast cancer Paternal Grandmother    Hypertension Mother    Other Father        unknown medical history    Social History Social History   Tobacco Use   Smoking status: Every Day    Packs/day: 1.00    Types: Cigarettes   Smokeless tobacco: Never  Vaping Use   Vaping Use: Never used  Substance Use Topics   Alcohol use: No   Drug use: No     Allergies   Vicodin [hydrocodone-acetaminophen]   Review of Systems Review of Systems  Constitutional:  Positive for appetite change, chills, fatigue and fever.  HENT:  Negative for congestion, facial swelling and trouble swallowing.   Respiratory:  Negative for cough and chest tightness.   Gastrointestinal:  Positive for abdominal pain, diarrhea and vomiting. Negative for blood in stool and nausea.  Genitourinary:  Negative for decreased urine volume, difficulty urinating, dysuria, hematuria, pelvic pain and vaginal discharge.  Musculoskeletal:  Negative for arthralgias and myalgias.  Skin:  Negative for color change and rash.  Allergic/Immunologic: Negative for environmental allergies, food allergies and immunocompromised state.  Neurological:  Negative for dizziness, tremors, seizures, syncope, facial  asymmetry, speech difficulty and numbness.  Hematological:  Negative for adenopathy. Does not bruise/bleed easily.    Physical Exam Triage Vital Signs ED Triage Vitals  Enc Vitals Group     BP 02/27/21 1425 129/83     Pulse Rate 02/27/21 1425 87     Resp 02/27/21 1425 14     Temp 02/27/21 1425 98.2 F (36.8 C)     Temp Source 02/27/21 1425 Oral     SpO2 02/27/21 1425 97 %     Weight 02/27/21 1422 230 lb (104.3 kg)     Height 02/27/21 1422 5\' 8"  (1.727 m)     Head Circumference --      Peak Flow --      Pain Score 02/27/21 1421 8      Pain Loc --      Pain Edu? --      Excl. in [place]? --    No data found.  Updated Vital Signs BP 129/83 (BP Location: Left Arm)   Pulse 87   Temp 98.2 F (36.8 C) (Oral)   Resp 14   Ht 5\' 8"  (1.727 m)   Wt 230 lb (104.3 kg)   LMP 02/20/2021 (Approximate)   SpO2 97%   BMI 34.97 kg/m   Visual Acuity Right Eye Distance:   Left Eye Distance:   Bilateral Distance:    Right Eye Near:   Left Eye Near:    Bilateral Near:     Physical Exam Vitals and nursing note reviewed.  Constitutional:      General: She is awake. She is not in acute distress.    Appearance: She is well-developed and well-groomed.     Comments: She is sitting on the exam table in no acute distress but appears uncomfortable due to pain.   HENT:     Head: Normocephalic and atraumatic.     Right Ear: Hearing normal.     Left Ear: Hearing normal.     Nose: Nose normal.     Mouth/Throat:     Lips: Pink.     Mouth: Mucous membranes are moist.     Pharynx: Oropharynx is clear. Uvula midline. No pharyngeal swelling, oropharyngeal exudate, posterior oropharyngeal erythema or uvula swelling.  Eyes:     Extraocular Movements: Extraocular movements intact.     Conjunctiva/sclera: Conjunctivae normal.  Cardiovascular:     Rate and Rhythm: Normal rate and regular rhythm.     Heart sounds: Normal heart sounds. No murmur heard. Pulmonary:     Effort: Pulmonary effort is normal. No respiratory distress.     Breath sounds: Normal breath sounds and air entry. No decreased air movement. No decreased breath sounds, wheezing, rhonchi or rales.  Abdominal:     General: Abdomen is flat. Bowel sounds are normal. There is no distension.     Palpations: Abdomen is soft. There is no hepatomegaly or splenomegaly.     Tenderness: There is generalized abdominal tenderness and tenderness in the periumbilical area. There is no right CVA tenderness, left CVA tenderness, guarding or rebound.       Comments: Occasional tenderness  around left mid to upper abdomen and central abdomen. No lower abdominal tenderness present. No CVA tenderness.   Musculoskeletal:        General: Normal range of motion.     Cervical back: Normal range of motion.  Skin:    General: Skin is warm and dry.     Capillary Refill: Capillary refill takes less than 2 seconds.  Findings: No rash.  Neurological:     General: No focal deficit present.     Mental Status: She is alert and oriented to person, place, and time.  Psychiatric:        Mood and Affect: Mood normal.        Behavior: Behavior normal. Behavior is cooperative.        Thought Content: Thought content normal.        Judgment: Judgment normal.     UC Treatments / Results  Labs (all labs ordered are listed, but only abnormal results are displayed) Labs Reviewed - No data to display  EKG   Radiology No results found.  Procedures Procedures (including critical care time)  Medications Ordered in UC Medications - No data to display  Initial Impression / Assessment and Plan / UC Course  I have reviewed the triage vital signs and the nursing notes.  Pertinent labs & imaging results that were available during my care of the patient were reviewed by me and considered in my medical decision making (see chart for details).     Reviewed that she may have some gastric and intestinal irritation from recent alcohol consumption and/or may have a GI viral illness. She is stable and no concerning clinical findings suggestive of more emergent evaluation. Do not feel lab work or imaging is needed at this time but may consider if symptoms do not improve. Discussed that she appears to have some intestinal spasms causing her pain. Will trial Bentyl 20mg  twice a day as directed. Continue to push fluids, especially with electrolytes to stay well hydrated. May slowly eat bland foods and advance diet as tolerated. Continue to monitor symptoms. Note written for work. If pain gets worse or  unable to keep down any fluids or fever is 102 or higher, go to the ER ASAP. Otherwise, follow-up in 2 to 3 days if not resolving.  Final Clinical Impressions(s) / UC Diagnoses   Final diagnoses:  Generalized abdominal pain  Spasm of bowel  Diarrhea, unspecified type     Discharge Instructions      Recommend start Bentyl 20mg  twice a day as needed for abdominal pain/cramping and diarrhea. Continue to push fluids. May slowly start bland foods and advance diet as tolerated. Continue to monitor symptoms. If pain gets worse, unable to keep down any fluids or fever of 102 or higher, go to the ER ASAP. Otherwise, follow-up in 2 to 3 days if not resolving.     ED Prescriptions     Medication Sig Dispense Auth. Provider   dicyclomine (BENTYL) 20 MG tablet Take 1 tablet (20 mg total) by mouth 2 (two) times daily. 20 tablet Laterrance Nauta, , NP      PDMP not reviewed this encounter.   , NP 02/28/21 1124

## 2021-02-27 NOTE — ED Triage Notes (Signed)
Patient reports stabbing left mid abdominal pain and vomiting that started yesterday morning.  Patient states that the vomiting has stopped.  Patient states that she still has the pain but it is not as severe but the pain radiates to her lower back.

## 2021-05-05 ENCOUNTER — Ambulatory Visit
Admission: EM | Admit: 2021-05-05 | Discharge: 2021-05-05 | Disposition: A | Discharge status: Home / Self Care | Payer: Self-pay | Attending: Emergency Medicine | Admitting: Emergency Medicine

## 2021-05-05 ENCOUNTER — Other Ambulatory Visit: Payer: Self-pay

## 2021-05-05 DIAGNOSIS — J40 Bronchitis, not specified as acute or chronic: Secondary | ICD-10-CM

## 2021-05-05 MED ORDER — PROMETHAZINE-DM 6.25-15 MG/5ML PO SYRP: 5.0000 mL | ORAL_SOLUTION | Freq: Four times a day (QID) | ORAL | 0 refills | Status: DC | PRN

## 2021-05-05 MED ORDER — ALBUTEROL SULFATE HFA 108 (90 BASE) MCG/ACT IN AERS: 2.0000 | INHALATION_SPRAY | RESPIRATORY_TRACT | 0 refills | Status: DC | PRN

## 2021-05-05 MED ORDER — IPRATROPIUM BROMIDE 0.06 % NA SOLN: 2.0000 | Freq: Four times a day (QID) | NASAL | 12 refills | Status: DC

## 2021-05-05 MED ORDER — BENZONATATE 100 MG PO CAPS: 200.0000 mg | ORAL_CAPSULE | Freq: Three times a day (TID) | ORAL | 0 refills | Status: DC

## 2021-05-05 MED ORDER — DOXYCYCLINE HYCLATE 100 MG PO CAPS: 100.0000 mg | ORAL_CAPSULE | Freq: Two times a day (BID) | ORAL | 0 refills | Status: DC

## 2021-05-05 MED ORDER — PREDNISONE 20 MG PO TABS: 60.0000 mg | ORAL_TABLET | Freq: Every day | ORAL | 0 refills | Status: AC

## 2021-05-05 NOTE — ED Provider Notes (Signed)
[facility]    CSN: [id number] Arrival date & time: 05/05/21  1519      History   Chief Complaint Chief Complaint  Patient presents with   Cough    Chest congestion     HPI Cassandra Pena is a 41 y.o. female.   HPI  41 year old female here for evaluation of respiratory complaints.  Patient reports that for last 4 days she has been experiencing a productive cough for a yellow sputum.  She has been treating this at home with NyQuil, DayQuil, and cough drops without any significant provement.  She also endorses a fever today with a T-max of 100.3, sore throat, wheezing, runny nose and nasal congestion.  She denies any ear pain, shortness of breath, or GI complaints.  Past Medical History:  Diagnosis Date   Anxiety    Depression    Endometriosis    Migraines    PTSD (post-traumatic stress disorder)     There are no problems to display for this patient.   Past Surgical History:  Procedure Laterality Date   CHOLECYSTECTOMY     LEEP     TUBAL LIGATION      OB History   No obstetric history on file.      Home Medications    Prior to Admission medications   Medication Sig Start Date End Date Taking? Authorizing Provider  albuterol (VENTOLIN HFA) 108 (90 Base) MCG/ACT inhaler Inhale 2 puffs into the lungs every 4 (four) hours as needed. 05/05/21  Yes Becky Augustayan, Gerod Caligiuri, NP  benzonatate (TESSALON) 100 MG capsule Take 2 capsules (200 mg total) by mouth every 8 (eight) hours. 05/05/21  Yes Becky Augustayan, Dub Maclellan, NP  doxycycline (VIBRAMYCIN) 100 MG capsule Take 1 capsule (100 mg total) by mouth 2 (two) times daily. 05/05/21  Yes Becky Augustayan, Jozsef Wescoat, NP  ipratropium (ATROVENT) 0.06 % nasal spray Place 2 sprays into both nostrils 4 (four) times daily. 05/05/21  Yes Becky Augustayan, Glenice Ciccone, NP  predniSONE (DELTASONE) 20 MG tablet Take 3 tablets (60 mg total) by mouth daily with breakfast for 5 days. 3 tablets daily for 5 days. 05/05/21 05/10/21 Yes Becky Augustayan, Edlyn Rosenburg, NP  promethazine-dextromethorphan  (PROMETHAZINE-DM) 6.25-15 MG/5ML syrup Take 5 mLs by mouth 4 (four) times daily as needed. 05/05/21  Yes Becky Augustayan, Kyler Lerette, NP  dicyclomine (BENTYL) 20 MG tablet Take 1 tablet (20 mg total) by mouth 2 (two) times daily. 02/27/21   Sudie GrumblingAmyot, Ann Berry, NP  SUMAtriptan (IMITREX) 50 MG tablet Take 1 tablet (50 mg total) by mouth once for 1 dose. May repeat in 2 hours if headache persists or recurs. 10/10/19 10/07/20  Tommie Samsook, Jayce G, DO  fluticasone (FLONASE) 50 MCG/ACT nasal spray Place 2 sprays into both nostrils daily. 06/25/18 12/01/18  Domenick GongMortenson, Ashley, MD    Family History Family History  Problem Relation Age of Onset   Breast cancer Paternal Aunt [id number]   Breast cancer Paternal Grandmother    Hypertension Mother    Other Father        unknown medical history    Social History Social History   Tobacco Use   Smoking status: Every Day    Packs/day: 1.00    Types: Cigarettes   Smokeless tobacco: Never  Vaping Use   Vaping Use: Never used  Substance Use Topics   Alcohol use: No   Drug use: No     Allergies   Bee venom, Vicodin [hydrocodone-acetaminophen], and Hydrocodone-acetaminophen   Review of Systems Review of Systems  Constitutional:  Positive for fever.  HENT:  Positive for congestion and rhinorrhea. Negative for ear pain and sore throat.   Respiratory:  Positive for cough and wheezing. Negative for shortness of breath.   Gastrointestinal:  Negative for diarrhea, nausea and vomiting.  Skin:  Negative for rash.  Hematological: Negative.   Psychiatric/Behavioral: Negative.      Physical Exam Triage Vital Signs ED Triage Vitals  Enc Vitals Group     BP 05/05/21 1712 (!) 140/102     Pulse Rate 05/05/21 1712 79     Resp 05/05/21 1712 16     Temp 05/05/21 1712 98.4 F (36.9 C)     Temp Source 05/05/21 1712 Oral     SpO2 05/05/21 1712 99 %     Weight --      Height --      Head Circumference --      Peak Flow --      Pain Score 05/05/21 1711 3     Pain Loc --      Pain  Edu? --      Excl. in GC? --    No data found.  Updated Vital Signs BP (!) 140/102 (BP Location: Left Arm)    Pulse 79    Temp 98.4 F (36.9 C) (Oral)    Resp 16    SpO2 99%   Visual Acuity Right Eye Distance:   Left Eye Distance:   Bilateral Distance:    Right Eye Near:   Left Eye Near:    Bilateral Near:     Physical Exam Vitals and nursing note reviewed.  Constitutional:      General: She is not in acute distress.    Appearance: Normal appearance. She is not ill-appearing.  HENT:     Head: Normocephalic and atraumatic.     Right Ear: Tympanic membrane, ear canal and external ear normal. There is no impacted cerumen.     Left Ear: Tympanic membrane, ear canal and external ear normal. There is no impacted cerumen.     Nose: Congestion and rhinorrhea present.     Mouth/Throat:     Mouth: Mucous membranes are moist.     Pharynx: Oropharynx is clear. No posterior oropharyngeal erythema.  Cardiovascular:     Rate and Rhythm: Normal rate and regular rhythm.     Pulses: Normal pulses.     Heart sounds: Normal heart sounds. No murmur heard.   No friction rub. No gallop.  Pulmonary:     Effort: Pulmonary effort is normal.     Breath sounds: Wheezing and rhonchi present. No rales.  Musculoskeletal:     Cervical back: Normal range of motion and neck supple.  Lymphadenopathy:     Cervical: No cervical adenopathy.  Skin:    General: Skin is warm and dry.     Capillary Refill: Capillary refill takes less than 2 seconds.     Findings: No erythema or rash.  Neurological:     General: No focal deficit present.     Mental Status: She is alert and oriented to person, place, and time.  Psychiatric:        Mood and Affect: Mood normal.        Behavior: Behavior normal.        Thought Content: Thought content normal.        Judgment: Judgment normal.     UC Treatments / Results  Labs (all labs ordered are listed, but only abnormal results are displayed) Labs Reviewed - No  data to  display  EKG   Radiology No results found.  Procedures Procedures (including critical care time)  Medications Ordered in UC Medications - No data to display  Initial Impression / Assessment and Plan / UC Course  I have reviewed the triage vital signs and the nursing notes.  Pertinent labs & imaging results that were available during my care of the patient were reviewed by me and considered in my medical decision making (see chart for details).  Patient is a very pleasant, nontoxic-appearing 41 year old female here for evaluation of respiratory complaints which predominantly consist of a productive cough that been going on for last 4 days.  She has had some upper respiratory complaints including runny nose nasal congestion and a sore throat.  She also endorses a fever today of 100.3.  She does endorse wheezing but no shortness of breath.  She is a smoker.  Her physical exam reveals protegrin tympanic membranes bilaterally with normal light reflex and clear external auditory canals.  Nasal mucosa is erythematous and edematous with clear discharge in both nares.  Oropharyngeal exam is benign.  No cervical lymphadenopathy appreciable exam.  Cardiopulmonary exam reveals wheezes and rhonchi in bilateral upper and middle lobes.  Bases are clear.  Patient exam is consistent with bronchitis.  Due to patient being a smoker I will put her on doxycycline twice daily for 10 days.  Prednisone as well to help with upper and lower respiratory inflammation 60 mg daily for 5 days.  I prescribed an albuterol inhaler to help with wheezing.  I have also prescribed Atrovent nasal spray to help with nasal congestion, Tessalon Perles and Promethazine DM cough should help with cough and congestion.  Work note provided.   Final Clinical Impressions(s) / UC Diagnoses   Final diagnoses:  Bronchitis     Discharge Instructions      Use the albuterol inhaler/nebulizer every 4-6 hours as needed for shortness  of breath, wheezing, and cough.  Take the prednisone according to the package instructions to help with pulmonary inflammation.  Use the Tessalon Perles every 8 hours for your cough.  Taken with a small sip of water.  They may give you some numbness to the base of your tongue or metallic taste in her mouth, this is normal.  They are designed to calm down the cough reflex.  Use the Promethazine DM cough syrup at bedtime as will make you drowsy.  You may take 1 teaspoon (5 mL) every 6 hours.  Take the Doxycycline twice daily for 10 days for the bronchitis.  Use the Atrovent nasal spray, 2 squirts in each nostril every 6 hours as needed for nasal congestion.  Return for reevaluation for new or worsening symptoms.      ED Prescriptions     Medication Sig Dispense Auth. Provider   albuterol (VENTOLIN HFA) 108 (90 Base) MCG/ACT inhaler Inhale 2 puffs into the lungs every 4 (four) hours as needed. 18 g Becky Augusta, NP   benzonatate (TESSALON) 100 MG capsule Take 2 capsules (200 mg total) by mouth every 8 (eight) hours. 21 capsule Becky Augusta, NP   doxycycline (VIBRAMYCIN) 100 MG capsule Take 1 capsule (100 mg total) by mouth 2 (two) times daily. 20 capsule Becky Augusta, NP   ipratropium (ATROVENT) 0.06 % nasal spray Place 2 sprays into both nostrils 4 (four) times daily. 15 mL Becky Augusta, NP   promethazine-dextromethorphan (PROMETHAZINE-DM) 6.25-15 MG/5ML syrup Take 5 mLs by mouth 4 (four) times daily as needed. 118 mL Becky Augusta, NP  predniSONE (DELTASONE) 20 MG tablet Take 3 tablets (60 mg total) by mouth daily with breakfast for 5 days. 3 tablets daily for 5 days. 15 tablet Becky Augusta, NP      PDMP not reviewed this encounter.   Becky Augusta, NP 05/05/21 1758

## 2021-05-05 NOTE — ED Triage Notes (Signed)
Patient presents to Urgent Care with complaints of productive cough and chest congestion since Friday, Treating symptoms with nyquil, dyquil, and cough drops with no improvement.   Denies fever.

## 2021-05-05 NOTE — Discharge Instructions (Signed)
Use the albuterol inhaler/nebulizer every 4-6 hours as needed for shortness of breath, wheezing, and cough.  Take the prednisone according to the package instructions to help with pulmonary inflammation.  Use the Tessalon Perles every 8 hours for your cough.  Taken with a small sip of water.  They may give you some numbness to the base of your tongue or metallic taste in her mouth, this is normal.  They are designed to calm down the cough reflex.  Use the Promethazine DM cough syrup at bedtime as will make you drowsy.  You may take 1 teaspoon (5 mL) every 6 hours.  Take the Doxycycline twice daily for 10 days for the bronchitis.  Use the Atrovent nasal spray, 2 squirts in each nostril every 6 hours as needed for nasal congestion.  Return for reevaluation for new or worsening symptoms.

## 2021-09-01 ENCOUNTER — Other Ambulatory Visit: Payer: Self-pay

## 2021-09-01 ENCOUNTER — Emergency Department (HOSPITAL_COMMUNITY)
Admission: EM | Admit: 2021-09-01 | Discharge: 2021-09-01 | Disposition: A | Discharge status: Home / Self Care | Payer: Medicaid Other | Attending: Emergency Medicine | Admitting: Emergency Medicine

## 2021-09-01 ENCOUNTER — Encounter (HOSPITAL_COMMUNITY): Payer: Self-pay | Admitting: Pharmacy Technician

## 2021-09-01 ENCOUNTER — Emergency Department (HOSPITAL_COMMUNITY): Payer: Medicaid Other

## 2021-09-01 DIAGNOSIS — R519 Headache, unspecified: Secondary | ICD-10-CM | POA: Insufficient documentation

## 2021-09-01 DIAGNOSIS — R202 Paresthesia of skin: Secondary | ICD-10-CM | POA: Diagnosis not present

## 2021-09-01 DIAGNOSIS — R791 Abnormal coagulation profile: Secondary | ICD-10-CM | POA: Insufficient documentation

## 2021-09-01 LAB — COMPREHENSIVE METABOLIC PANEL
ALT: 34 U/L (ref 0–44)
AST: 26 U/L (ref 15–41)
Albumin: 4.1 g/dL (ref 3.5–5.0)
Alkaline Phosphatase: 60 U/L (ref 38–126)
Anion gap: 6 (ref 5–15)
BUN: 12 mg/dL (ref 6–20)
CO2: 24 mmol/L (ref 22–32)
Calcium: 9 mg/dL (ref 8.9–10.3)
Chloride: 109 mmol/L (ref 98–111)
Creatinine, Ser: 0.8 mg/dL (ref 0.44–1.00)
GFR, Estimated: >60 mL/min (ref >60)
Glucose, Bld: 95 mg/dL (ref 70–99)
Potassium: 3.6 mmol/L (ref 3.5–5.1)
Sodium: 139 mmol/L (ref 135–145)
Total Bilirubin: 0.6 mg/dL (ref 0.3–1.2)
Total Protein: 6.8 g/dL (ref 6.5–8.1)

## 2021-09-01 LAB — DIFFERENTIAL
Abs Immature Granulocytes: 0.12 10*3/uL — ABNORMAL HIGH (ref 0.00–0.07)
Basophils Absolute: 0 10*3/uL (ref 0.0–0.1)
Basophils Relative: 0 %
Eosinophils Absolute: 0.2 10*3/uL (ref 0.0–0.5)
Eosinophils Relative: 2 %
Immature Granulocytes: 1 %
Lymphocytes Relative: 39 %
Lymphs Abs: 4 10*3/uL (ref 0.7–4.0)
Monocytes Absolute: 0.6 10*3/uL (ref 0.1–1.0)
Monocytes Relative: 6 %
Neutro Abs: 5.5 10*3/uL (ref 1.7–7.7)
Neutrophils Relative %: 52 %

## 2021-09-01 LAB — I-STAT CHEM 8, ED
BUN: 10 mg/dL (ref 6–20)
Calcium, Ion: 1.18 mmol/L (ref 1.15–1.40)
Chloride: 105 mmol/L (ref 98–111)
Creatinine, Ser: 0.6 mg/dL (ref 0.44–1.00)
Glucose, Bld: 94 mg/dL (ref 70–99)
HCT: 42 % (ref 36.0–46.0)
Hemoglobin: 14.3 g/dL (ref 12.0–15.0)
Potassium: 3.7 mmol/L (ref 3.5–5.1)
Sodium: 139 mmol/L (ref 135–145)
TCO2: 24 mmol/L (ref 22–32)

## 2021-09-01 LAB — PROTIME-INR
INR: 0.9 (ref 0.8–1.2)
Prothrombin Time: 12.3 seconds (ref 11.4–15.2)

## 2021-09-01 LAB — CBC
HCT: 41.4 % (ref 36.0–46.0)
Hemoglobin: 14 g/dL (ref 12.0–15.0)
MCH: 32 pg (ref 26.0–34.0)
MCHC: 33.8 g/dL (ref 30.0–36.0)
MCV: 94.5 fL (ref 80.0–100.0)
Platelets: 290 10*3/uL (ref 150–400)
RBC: 4.38 MIL/uL (ref 3.87–5.11)
RDW: 12.5 % (ref 11.5–15.5)
WBC: 10.4 10*3/uL (ref 4.0–10.5)
nRBC: 0 % (ref 0.0–0.2)

## 2021-09-01 LAB — ETHANOL: Alcohol, Ethyl (B): <10 mg/dL (ref <10)

## 2021-09-01 LAB — I-STAT BETA HCG BLOOD, ED (MC, WL, AP ONLY): I-stat hCG, quantitative: <5 m[IU]/mL (ref <5)

## 2021-09-01 LAB — APTT: aPTT: 25 seconds (ref 24–36)

## 2021-09-01 MED ORDER — DEXAMETHASONE SODIUM PHOSPHATE 10 MG/ML IJ SOLN
10.0000 mg | Freq: Once | INTRAMUSCULAR | Status: AC
Start: 2021-09-01 — End: 2021-09-01
  Administered 2021-09-01: 10 mg via INTRAVENOUS
  Filled 2021-09-01: qty 1

## 2021-09-01 MED ORDER — METOCLOPRAMIDE HCL 5 MG/ML IJ SOLN
10.0000 mg | Freq: Once | INTRAMUSCULAR | Status: AC
  Administered 2021-09-01: 10 mg via INTRAVENOUS
  Filled 2021-09-01: qty 2

## 2021-09-01 MED ORDER — DIPHENHYDRAMINE HCL 50 MG/ML IJ SOLN
12.5000 mg | Freq: Once | INTRAMUSCULAR | Status: AC
  Administered 2021-09-01: 12.5 mg via INTRAVENOUS
  Filled 2021-09-01: qty 1

## 2021-09-01 MED ORDER — SODIUM CHLORIDE 0.9 % IV BOLUS
500.0000 mL | Freq: Once | INTRAVENOUS | Status: AC
  Administered 2021-09-01: 500 mL via INTRAVENOUS

## 2021-09-01 MED ORDER — KETOROLAC TROMETHAMINE 15 MG/ML IJ SOLN
15.0000 mg | Freq: Once | INTRAMUSCULAR | Status: AC
Start: 2021-09-01 — End: 2021-09-01
  Administered 2021-09-01: 15 mg via INTRAVENOUS
  Filled 2021-09-01: qty 1

## 2021-09-01 NOTE — ED Provider Triage Note (Signed)
Emergency Medicine Provider Triage Evaluation Note  Cassandra Pena , a 41 y.o. female  was evaluated in triage.  Pt complains of headache. Went to bed at 9pm feeling fine, woke up at 6am with headache- diffuse, pulsatile in temples, went back to sleep and got up at 7:30am. Took Aleve with some relief briefly then headache returned. Denies light/sound sensitivity, nausea, vomiting, changes in vision, speech, gait. At 3pm today noticed tingling/pins/needles in the tips of all of the fingers of the left hand, now up into the forearm.  Red in the face and monitoring BP for the past 2 weeks. Bp at work 160/104, up to 156/118 today. No history of HTN previously Review of Systems  Positive: Headache, tingling left hand/arm Negative: Weakness, changes in vision/speech/gait  Physical Exam  BP (!) 152/102 (BP Location: Right Arm)   Pulse 89   Temp 97.9 F (36.6 C) (Oral)   Resp 20   SpO2 100%  Gen:   Awake, no distress   Resp:  Normal effort  MSK:   Moves extremities without difficulty  Other:  No arm drift, no facial asymmetry, arm strength equal  Medical Decision Making  Medically screening exam initiated at 4:03 PM.  Appropriate orders placed.  TAUSHA SHIFF was informed that the remainder of the evaluation will be completed by another provider, this initial triage assessment does not replace that evaluation, and the importance of remaining in the ED until their evaluation is complete.     Tacy Learn, PA-C 09/01/21 [phone]

## 2021-09-01 NOTE — ED Provider Notes (Signed)
[facility] DEPT Provider Note   CSN: [id number] Arrival date & time: 09/01/21  1546     History  Chief Complaint  Patient presents with   Headache   L Arm Tingling    Cassandra Pena is a 41 y.o. female.  Pt complains of headache. Went to bed at 9pm feeling fine, woke up at 6am with headache- diffuse, pulsatile in temples, went back to sleep and got up at 7:30am. Took Aleve with some relief briefly then headache returned. Denies light/sound sensitivity, nausea, vomiting, changes in vision, speech, gait. At 3pm today noticed tingling/pins/needles in the tips of all of the fingers of the left hand, now up into the forearm.  Red in the face and monitoring BP for the past 2 weeks. Bp at work 160/104, up to 156/118 today. No history of HTN previously      Home Medications Prior to Admission medications   Medication Sig Start Date End Date Taking? Authorizing Provider  albuterol (VENTOLIN HFA) 108 (90 Base) MCG/ACT inhaler Inhale 2 puffs into the lungs every 4 (four) hours as needed. Patient not taking: Reported on 09/01/2021 05/05/21   Margarette Canada, NP  benzonatate (TESSALON) 100 MG capsule Take 2 capsules (200 mg total) by mouth every 8 (eight) hours. Patient not taking: Reported on 09/01/2021 05/05/21   Margarette Canada, NP  dicyclomine (BENTYL) 20 MG tablet Take 1 tablet (20 mg total) by mouth 2 (two) times daily. Patient not taking: Reported on 09/01/2021 02/27/21   Katy Apo, NP  doxycycline (VIBRAMYCIN) 100 MG capsule Take 1 capsule (100 mg total) by mouth 2 (two) times daily. Patient not taking: Reported on 09/01/2021 05/05/21   Margarette Canada, NP  ipratropium (ATROVENT) 0.06 % nasal spray Place 2 sprays into both nostrils 4 (four) times daily. Patient not taking: Reported on 09/01/2021 05/05/21   Margarette Canada, NP  promethazine-dextromethorphan (PROMETHAZINE-DM) 6.25-15 MG/5ML syrup Take 5 mLs by mouth 4 (four) times daily as needed. Patient not  taking: Reported on 09/01/2021 05/05/21   Margarette Canada, NP  SUMAtriptan (IMITREX) 50 MG tablet Take 1 tablet (50 mg total) by mouth once for 1 dose. May repeat in 2 hours if headache persists or recurs. 10/10/19 10/07/20  Coral Spikes, DO  fluticasone (FLONASE) 50 MCG/ACT nasal spray Place 2 sprays into both nostrils daily. 06/25/18 12/01/18  Melynda Ripple, MD      Allergies    Bee venom, Vicodin [hydrocodone-acetaminophen], and Hydrocodone-acetaminophen    Review of Systems   Review of Systems Negative except as per HPI Physical Exam Updated Vital Signs BP (!) 148/95   Pulse (!) 57   Temp 97.9 F (36.6 C) (Oral)   Resp 19   SpO2 97%  Physical Exam Vitals and nursing note reviewed.  Constitutional:      General: She is not in acute distress.    Appearance: She is well-developed. She is not diaphoretic.  HENT:     Head: Normocephalic and atraumatic.     Mouth/Throat:     Mouth: Mucous membranes are moist.  Eyes:     General: No visual field deficit.    Extraocular Movements: Extraocular movements intact.     Pupils: Pupils are equal, round, and reactive to light.  Cardiovascular:     Rate and Rhythm: Normal rate and regular rhythm.     Heart sounds: Normal heart sounds.  Pulmonary:     Effort: Pulmonary effort is normal.     Breath sounds: Normal breath sounds.  Musculoskeletal:     Cervical back: Neck supple.  Skin:    General: Skin is warm and dry.  Neurological:     Mental Status: She is alert and oriented to person, place, and time.     GCS: GCS eye subscore is 4. GCS verbal subscore is 5. GCS motor subscore is 6.     Cranial Nerves: No cranial nerve deficit, dysarthria or facial asymmetry.     Sensory: Sensory deficit present.     Motor: No weakness.     Gait: Gait normal.     Comments: Reports a pins-and-needles sensation to the left arm from forearm distally to fingertips. Cap refill normal, radial pulse pulse present.   Psychiatric:        Behavior: Behavior  normal.    ED Results / Procedures / Treatments   Labs (all labs ordered are listed, but only abnormal results are displayed) Labs Reviewed  DIFFERENTIAL - Abnormal; Notable for the following components:      Result Value   Abs Immature Granulocytes 0.12 (*)    All other components within normal limits  ETHANOL  PROTIME-INR  APTT  CBC  COMPREHENSIVE METABOLIC PANEL  RAPID URINE DRUG SCREEN, HOSP PERFORMED  URINALYSIS, ROUTINE W REFLEX MICROSCOPIC  I-STAT CHEM 8, ED  I-STAT BETA HCG BLOOD, ED ([facility], [facility], [facility] ONLY)    EKG None  Radiology CT HEAD WO CONTRAST  Result Date: 09/01/2021 CLINICAL DATA:  Headache in the setting of hypertension. EXAM: CT HEAD WITHOUT CONTRAST TECHNIQUE: Contiguous axial images were obtained from the base of the skull through the vertex without intravenous contrast. RADIATION DOSE REDUCTION: This exam was performed according to the departmental dose-optimization program which includes automated exposure control, adjustment of the mA and/or kV according to patient size and/or use of iterative reconstruction technique. COMPARISON:  CT September 15, 2010 FINDINGS: Brain: No evidence of acute infarction, hemorrhage, hydrocephalus, extra-axial collection or mass lesion/mass effect. Vascular: No hyperdense vessel or unexpected calcification. Skull: Normal. Negative for fracture or focal lesion. Sinuses/Orbits: Visualized portions of the paranasal sinuses are predominantly clear. Orbits are unremarkable. Other: Mastoid air cells are predominantly clear. IMPRESSION: No acute intracranial abnormality. Electronically Signed   By: Dahlia Bailiff M.D.   On: 09/01/2021 16:47    Procedures Procedures    Medications Ordered in ED Medications  ketorolac (TORADOL) 15 MG/ML injection 15 mg (15 mg Intravenous Given 09/01/21 1753)  dexamethasone (DECADRON) injection 10 mg (10 mg Intravenous Given 09/01/21 1753)  diphenhydrAMINE (BENADRYL) injection 12.5 mg (12.5 mg Intravenous Given  09/01/21 1753)  metoCLOPramide (REGLAN) injection 10 mg (10 mg Intravenous Given 09/01/21 1753)  sodium chloride 0.9 % bolus 500 mL (500 mLs Intravenous New Bag/Given 09/01/21 1754)    ED Course/ Medical Decision Making/ A&P                           Medical Decision Making Amount and/or Complexity of Data Reviewed Labs: ordered. Radiology: ordered.  Risk Prescription drug management.   This patient presents to the ED for concern of headache with numbness in left arm, this involves an extensive number of treatment options, and is a complaint that carries with it a high risk of complications and morbidity.  The differential diagnosis includes migraine, CVA, MS   Co morbidities that complicate the patient evaluation  migraines   Additional history obtained:  External records from outside source obtained and reviewed including  prior head CT 2012 for headache- normal  Lab Tests:  I Ordered, and personally interpreted labs.  The pertinent results include: CBC, CMP within normal limits, hcg negative   Imaging Studies ordered:  I ordered imaging studies including CT head  I independently visualized and interpreted imaging which showed no acute abnormality  I agree with the radiologist interpretation   Cardiac Monitoring: / EKG:  The patient was maintained on a cardiac monitor.  I personally viewed and interpreted the cardiac monitored which showed an underlying rhythm of: sinus rhythm, rate 75   Consultations Obtained:  I requested consultation with the neurologist, Dr. Theda Sers,  and discussed lab and imaging findings as well as pertinent plan - they recommend: Treatment for migraine and may be discharged, no further work-up needed in the emergency room.   Problem List / ED Course / Critical interventions / Medication management  41 year old female with history of migraines presents with complaint of headache with tingling in left hand and forearm as above.  At time of  recheck, tingling has improved somewhat, no longer located in the forearm, has been back to the fingers and hand.  Call to neurology who suspects migraine, recommend treatment for migraine and follow-up.  Patient was given IV fluid bolus of migraine cocktail with improvement but not resolution of symptoms.  She is encouraged to go home and rest, follow-up with her primary care provider this week, trend blood pressures and return to ED for any worsening or concerning symptoms. I ordered medication including IV fluids, Benadryl, Toradol, Decadron, Reglan for headache Reevaluation of the patient after these medicines showed that the patient improved I have reviewed the patients home medicines and have made adjustments as needed   Social Determinants of Health:  Reports PCP however not found on file.    Test / Admission - Considered:  Consider MRI however after consult with neurology, no further work-up needed at this time.  Patient encouraged to follow-up with her primary care provider.         Final Clinical Impression(s) / ED Diagnoses Final diagnoses:  Acute nonintractable headache, unspecified headache type  Paresthesia    Rx / DC Orders ED Discharge Orders     None         Tacy Learn, PA-C 09/01/21 1833    Tegeler, Gwenyth Allegra, MD 09/01/21 2133

## 2021-09-01 NOTE — ED Triage Notes (Signed)
Pt here with reports of having a headache and hypertension today. Pt states BP 160/118 earlier today. Pt also reports that approx 1300 today she noticed a tingling to her L hand. No facial droop, no weakness, no vision changes. Pt with sensory deficit to L arm.

## 2021-09-01 NOTE — Discharge Instructions (Signed)
Monitor your blood pressure, record readings and follow-up with your primary care provider for recheck later this week.  Return to the emergency room for any worsening or concerning symptoms.

## 2022-04-02 ENCOUNTER — Encounter: Payer: Self-pay | Admitting: Emergency Medicine

## 2022-04-02 ENCOUNTER — Ambulatory Visit
Admission: EM | Admit: 2022-04-02 | Discharge: 2022-04-02 | Disposition: A | Discharge status: Home / Self Care | Payer: Self-pay | Attending: Family Medicine | Admitting: Family Medicine

## 2022-04-02 DIAGNOSIS — Z20828 Contact with and (suspected) exposure to other viral communicable diseases: Secondary | ICD-10-CM

## 2022-04-02 DIAGNOSIS — J069 Acute upper respiratory infection, unspecified: Secondary | ICD-10-CM

## 2022-04-02 MED ORDER — ALBUTEROL SULFATE HFA 108 (90 BASE) MCG/ACT IN AERS
2.0000 | INHALATION_SPRAY | RESPIRATORY_TRACT | 0 refills | Status: DC | PRN
Start: 2022-04-02 — End: 2022-05-16

## 2022-04-02 MED ORDER — PROMETHAZINE-DM 6.25-15 MG/5ML PO SYRP: 5.0000 mL | ORAL_SOLUTION | Freq: Four times a day (QID) | ORAL | 0 refills | Status: DC | PRN

## 2022-04-02 NOTE — ED Provider Notes (Signed)
[facility]    CSN: [id number] Arrival date & time: 04/02/22  6440      History   Chief Complaint Chief Complaint  Patient presents with   Cough    HPI Cassandra Pena is a 41 y.o. female.   HPI   Cassandra Pena presents for cough that started 3 days ago. States she had an exposure to RSV at work. Has body aches, chills, headache. She has been taking cough meds for her symptoms without relief. Requests work note. Smokes cigarettes.      Past Medical History:  Diagnosis Date   Anxiety    Depression    Endometriosis    Migraines    PTSD (post-traumatic stress disorder)     There are no problems to display for this patient.   Past Surgical History:  Procedure Laterality Date   CHOLECYSTECTOMY     LEEP     TUBAL LIGATION      OB History   No obstetric history on file.      Home Medications    Prior to Admission medications   Medication Sig Start Date End Date Taking? Authorizing Provider  albuterol (VENTOLIN HFA) 108 (90 Base) MCG/ACT inhaler Inhale 2 puffs into the lungs every 4 (four) hours as needed. 04/02/22   Shawnique Mariotti, Seward Meth, DO  benzonatate (TESSALON) 100 MG capsule Take 2 capsules (200 mg total) by mouth every 8 (eight) hours. Patient not taking: Reported on 09/01/2021 05/05/21   Becky Augusta, NP  dicyclomine (BENTYL) 20 MG tablet Take 1 tablet (20 mg total) by mouth 2 (two) times daily. Patient not taking: Reported on 09/01/2021 02/27/21   Sudie Grumbling, NP  doxycycline (VIBRAMYCIN) 100 MG capsule Take 1 capsule (100 mg total) by mouth 2 (two) times daily. Patient not taking: Reported on 09/01/2021 05/05/21   Becky Augusta, NP  ipratropium (ATROVENT) 0.06 % nasal spray Place 2 sprays into both nostrils 4 (four) times daily. Patient not taking: Reported on 09/01/2021 05/05/21   Becky Augusta, NP  promethazine-dextromethorphan (PROMETHAZINE-DM) 6.25-15 MG/5ML syrup Take 5 mLs by mouth 4 (four) times daily as needed. 04/02/22   Maly Lemarr, Seward Meth, DO   SUMAtriptan (IMITREX) 50 MG tablet Take 1 tablet (50 mg total) by mouth once for 1 dose. May repeat in 2 hours if headache persists or recurs. 10/10/19 10/07/20  Tommie Sams, DO  fluticasone (FLONASE) 50 MCG/ACT nasal spray Place 2 sprays into both nostrils daily. 06/25/18 12/01/18  Domenick Gong, MD    Family History Family History  Problem Relation Age of Onset   Breast cancer Paternal Aunt 75   Breast cancer Paternal Grandmother    Hypertension Mother    Other Father        unknown medical history    Social History Social History   Tobacco Use   Smoking status: Every Day    Packs/day: 1.00    Types: Cigarettes   Smokeless tobacco: Never  Vaping Use   Vaping Use: Never used  Substance Use Topics   Alcohol use: No   Drug use: No     Allergies   Bee venom, Vicodin [hydrocodone-acetaminophen], and Hydrocodone-acetaminophen   Review of Systems Review of Systems: negative unless otherwise stated in HPI.      Physical Exam Triage Vital Signs ED Triage Vitals  Enc Vitals Group     BP 04/02/22 0859 124/87     Pulse Rate 04/02/22 0859 82     Resp 04/02/22 0859 15     Temp 04/02/22  0859 98 F (36.7 C)     Temp Source 04/02/22 0859 Oral     SpO2 04/02/22 0859 95 %     Weight 04/02/22 0857 229 lb 15 oz (104.3 kg)     Height 04/02/22 0857 5\' 8"  (1.727 m)     Head Circumference --      Peak Flow --      Pain Score 04/02/22 0856 0     Pain Loc --      Pain Edu? --      Excl. in GC? --    No data found.  Updated Vital Signs BP 124/87 (BP Location: Right Arm)   Pulse 82   Temp 98 F (36.7 C) (Oral)   Resp 15   Ht 5\' 8"  (1.727 m)   Wt 104.3 kg   LMP 03/29/2022 (Approximate)   SpO2 95%   BMI 34.96 kg/m   Visual Acuity Right Eye Distance:   Left Eye Distance:   Bilateral Distance:    Right Eye Near:   Left Eye Near:    Bilateral Near:     Physical Exam GEN:     alert, non-toxic appearing female in no distress    HENT:  mucus membranes moist,  oropharyngeal without lesions or exudate, moderate erythematous edematous turbinates, clear nasal discharge EYES:   pupils equal and reactive, no scleral injection or discharge NECK:  normal ROM, no meningismus   RESP:  no increased work of breathing, clear to auscultation bilaterally CVS:   regular rate and rhythm Skin:   warm and dry    UC Treatments / Results  Labs (all labs ordered are listed, but only abnormal results are displayed) Labs Reviewed - No data to display  EKG   Radiology No results found.  Procedures Procedures (including critical care time)  Medications Ordered in UC Medications - No data to display  Initial Impression / Assessment and Plan / UC Course  I have reviewed the triage vital signs and the nursing notes.  Pertinent labs & imaging results that were available during my care of the patient were reviewed by me and considered in my medical decision making (see chart for details).       Pt is a 41 y.o. female who presents for 3 days of respiratory symptoms. Cellia is afebrile here without recent antipyretics. Satting well on room air. Overall pt is non-toxic appearing, well hydrated, without respiratory distress. Pulmonary exam is unremarkable.  COVID and influenza testing declined. History consistent with viral respiratory illness. Discussed symptomatic treatment. Albuterol and promethazine DM sent to preferred pharmacy.  Explained lack of efficacy of antibiotics in viral disease.  Typical duration of symptoms discussed.   Return and ED precautions given and voiced understanding. Discussed MDM, treatment plan and plan for follow-up with patient who agrees with plan.     Final Clinical Impressions(s) / UC Diagnoses   Final diagnoses:  Viral URI with cough  RSV exposure     Discharge Instructions      If your were prescribed medication, stop by the pharmacy to pick them up.   You can take Tylenol and/or Ibuprofen as needed for fever  reduction and pain relief.    For cough: honey 1/2 to 1 teaspoon (you can dilute the honey in water or another fluid).  You can also use guaifenesin and dextromethorphan for cough. You can use a humidifier for chest congestion and cough.  If you don't have a humidifier, you can sit in the bathroom with  the hot shower running.      For sore throat: try warm salt water gargles, Mucinex sore throat cough drops or cepacol lozenges, throat spray, warm tea or water with lemon/honey, popsicles or ice, or OTC cold relief medicine for throat discomfort. You can also purchase chloraseptic spray at the pharmacy or dollar store.   For congestion: take a daily anti-histamine like Zyrtec, Claritin, and a oral decongestant, such as pseudoephedrine.  You can also use Flonase 1-2 sprays in each nostril daily. Afrin is also a good option, if you do not have high blood pressure.    It is important to stay hydrated: drink plenty of fluids (water, gatorade/powerade/pedialyte, juices, or teas) to keep your throat moisturized and help further relieve irritation/discomfort.    Return or go to the Emergency Department if symptoms worsen or do not improve in the next few days      ED Prescriptions     Medication Sig Dispense Auth. Provider   albuterol (VENTOLIN HFA) 108 (90 Base) MCG/ACT inhaler Inhale 2 puffs into the lungs every 4 (four) hours as needed. 18 g Frona Yost, DO   promethazine-dextromethorphan (PROMETHAZINE-DM) 6.25-15 MG/5ML syrup Take 5 mLs by mouth 4 (four) times daily as needed. 118 mL Lyndee Hensen, DO      PDMP not reviewed this encounter.   Lyndee Hensen, DO 04/02/22 [phone]

## 2022-04-02 NOTE — ED Triage Notes (Addendum)
Patient c/o cough and headache that started yesterday.  Patient states that she was exposed to RSV at work.  Patient does not want to be tested for COVID or Flu.  Patient does need a work note.

## 2022-04-02 NOTE — Discharge Instructions (Addendum)
If your were prescribed medication, stop by the pharmacy to pick them up.   You can take Tylenol and/or Ibuprofen as needed for fever reduction and pain relief.    For cough: honey 1/2 to 1 teaspoon (you can dilute the honey in water or another fluid).  You can also use guaifenesin and dextromethorphan for cough. You can use a humidifier for chest congestion and cough.  If you don't have a humidifier, you can sit in the bathroom with the hot shower running.      For sore throat: try warm salt water gargles, Mucinex sore throat cough drops or cepacol lozenges, throat spray, warm tea or water with lemon/honey, popsicles or ice, or OTC cold relief medicine for throat discomfort. You can also purchase chloraseptic spray at the pharmacy or dollar store.   For congestion: take a daily anti-histamine like Zyrtec, Claritin, and a oral decongestant, such as pseudoephedrine.  You can also use Flonase 1-2 sprays in each nostril daily. Afrin is also a good option, if you do not have high blood pressure.    It is important to stay hydrated: drink plenty of fluids (water, gatorade/powerade/pedialyte, juices, or teas) to keep your throat moisturized and help further relieve irritation/discomfort.    Return or go to the Emergency Department if symptoms worsen or do not improve in the next few days   

## 2022-04-20 ENCOUNTER — Ambulatory Visit: Payer: Self-pay

## 2022-05-05 ENCOUNTER — Ambulatory Visit: Payer: Self-pay | Admitting: Family

## 2022-05-05 ENCOUNTER — Encounter: Payer: Self-pay | Admitting: Advanced Practice Midwife

## 2022-05-05 VITALS — Wt 223.4 lb

## 2022-05-05 DIAGNOSIS — Z113 Encounter for screening for infections with a predominantly sexual mode of transmission: Secondary | ICD-10-CM

## 2022-05-05 LAB — WET PREP FOR TRICH, YEAST, CLUE
Trichomonas Exam: POSITIVE — AB
Yeast Exam: NEGATIVE

## 2022-05-05 LAB — HM HIV SCREENING LAB: HM HIV Screening: NEGATIVE

## 2022-05-05 LAB — HM HEPATITIS C SCREENING LAB: HM Hepatitis Screen: NEGATIVE

## 2022-05-05 LAB — HEPATITIS B SURFACE ANTIGEN

## 2022-05-05 MED ORDER — DOXYCYCLINE HYCLATE 100 MG PO TABS: 100.0000 mg | ORAL_TABLET | Freq: Two times a day (BID) | ORAL | 0 refills | Status: AC

## 2022-05-05 MED ORDER — CLOTRIMAZOLE 1 % VA CREA: 1.0000 | TOPICAL_CREAM | Freq: Every day | VAGINAL | 0 refills | Status: DC

## 2022-05-05 MED ORDER — METRONIDAZOLE 500 MG PO TABS: 500.0000 mg | ORAL_TABLET | Freq: Two times a day (BID) | ORAL | 0 refills | Status: DC

## 2022-05-05 MED ORDER — CLOTRIMAZOLE 1 % VA CREA: 1.0000 | TOPICAL_CREAM | Freq: Every day | VAGINAL | 0 refills | Status: AC

## 2022-05-05 MED ORDER — CEFTRIAXONE SODIUM 500 MG IJ SOLR
500.0000 mg | Freq: Once | INTRAMUSCULAR | Status: AC
  Administered 2022-05-05: 500 mg via INTRAMUSCULAR

## 2022-05-05 NOTE — Progress Notes (Unsigned)
Reports use of 1 day Monistat on 04/17/22 or 04/18/22. Reports has counseling appt with [facility] on 05/12/2022 at 10 am. Per client, Ms. White spoke with [facility] this am and to proceed with STI testing today. Rich Number, RN Per [facility] FNP, treat for gonorrhea and chlamydia as per standing order. Tolerated Ceftriaxone injection without complaint. Wet prep reviewed and treated for trich per standing order. Per verbal order of [facility] FNP, client may take Doxycycline and Metronidazole together BID with food. Per verbal order [facility] FNP, dispense Clotrimazole 1% vaginal cream as per standing order to begin at completion of oral antibiotics. Rich Number, RN

## 2022-05-05 NOTE — Progress Notes (Signed)
[facility] Department  STI clinic/screening visit [place] [address] [phone]  Subjective:  Cassandra Pena is a 42 y.o. female being seen today for an STI screening visit. The patient reports they {Actions; do/do not:19616} have symptoms.  Patient reports that they {Actions; do/do not:19616} desire a pregnancy in the next year.   They reported they {Actions; are/are not:16769} interested in discussing contraception today.    Patient's last menstrual period was 04/27/2022 (exact date).  Patient has the following medical conditions:  There are no problems to display for this patient.   Chief Complaint  Patient presents with  . SEXUALLY TRANSMITTED DISEASE    HPI  Patient reports ***  Does the patient using douching products? {yes/no:20286}  Last HIV test per patient/review of record was No results found for: "HMHIVSCREEN" No results found for: "HIV" Patient reports last pap was No results found for: "DIAGPAP" ***  Screening for MPX risk: Does the patient have an unexplained rash? {yes/no:20286} Is the patient MSM? {yes/no:20286} Does the patient endorse multiple sex partners or anonymous sex partners? {yes/no:20286} Did the patient have close or sexual contact with a person diagnosed with MPX? {yes/no:20286} Has the patient traveled outside the [place] where MPX is endemic? {yes/no:20286} Is there a high clinical suspicion for MPX-- evidenced by one of the following {yes/no:20286}  -Unlikely to be chickenpox  -Lymphadenopathy  -Rash that present in same phase of evolution on any given body part See flowsheet for further details and programmatic requirements.   Immunization history:  Immunization History  Administered Date(s) Administered  . Tdap 04/13/2011     The following portions of the patient's history were reviewed and updated as appropriate: allergies, current medications, past medical history, past social history, past  surgical history and problem list.  Objective:  There were no vitals filed for this visit.  Physical Exam   Assessment and Plan:  Cassandra Pena is a 42 y.o. female presenting to the [facility] Department for STI screening  1. Screening for venereal disease Treat for GC and co-treat for CT per standing orders - Chlamydia/Gonorrhea [place] [facility] - Syphilis Serology, [place] [facility] - WET PREP FOR [place], YEAST, CLUE - HIV/HCV [place] - HBV Antigen/Antibody [facility] - Virology, [place] [facility] - Gonococcus culture   Patient accepted all screenings including ***oral, vaginal CT/GC and bloodwork for HIV/RPR, and wet prep. Patient meets criteria for HepB screening? {yes/no:20286}. Ordered? {Response; yes/no/na:63} Patient meets criteria for HepC screening? {yes/no:20286}. Ordered? {Response; yes/no/na:63}  Treat wet prep per standing order Discussed time line for [facility] results and that patient will be called with positive results and encouraged patient to call if she had not heard in 2 weeks.  Counseled to return or seek care for continued or worsening symptoms Recommended condom use with all sex  Patient is currently using {[facility] Contraception:[id number]} to prevent pregnancy.    Return if symptoms worsen or fail to improve.  No future appointments.  Marline Backbone, FNP

## 2022-05-09 LAB — GONOCOCCUS CULTURE

## 2022-05-12 ENCOUNTER — Encounter: Payer: Self-pay | Admitting: Emergency Medicine

## 2022-05-12 DIAGNOSIS — T424X1A Poisoning by benzodiazepines, accidental (unintentional), initial encounter: Secondary | ICD-10-CM | POA: Insufficient documentation

## 2022-05-12 DIAGNOSIS — R5383 Other fatigue: Secondary | ICD-10-CM | POA: Insufficient documentation

## 2022-05-12 DIAGNOSIS — Z5321 Procedure and treatment not carried out due to patient leaving prior to being seen by health care provider: Secondary | ICD-10-CM | POA: Insufficient documentation
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
## 2022-05-12 NOTE — ED Triage Notes (Signed)
Pt presents via [facility] after taking 3 (0.5mg ) Xanax tonight and driving. Pt was given Narcan by PD. Pt endorses fatigue but is A&Ox4. Denies CP or SOB.

## 2022-05-13 ENCOUNTER — Emergency Department
Admission: EM | Admit: 2022-05-13 | Discharge: 2022-05-14 | Disposition: A | Discharge status: Other Acute Inpatient Hospital | Payer: BLUE CROSS/BLUE SHIELD | Attending: Emergency Medicine | Admitting: Emergency Medicine

## 2022-05-13 ENCOUNTER — Emergency Department
Admission: EM | Admit: 2022-05-13 | Discharge: 2022-05-13 | Discharge status: Against Medical Advice | Payer: Self-pay | Attending: Emergency Medicine | Admitting: Emergency Medicine

## 2022-05-13 DIAGNOSIS — Z20822 Contact with and (suspected) exposure to covid-19: Secondary | ICD-10-CM | POA: Diagnosis not present

## 2022-05-13 DIAGNOSIS — D72829 Elevated white blood cell count, unspecified: Secondary | ICD-10-CM | POA: Insufficient documentation

## 2022-05-13 DIAGNOSIS — F331 Major depressive disorder, recurrent, moderate: Secondary | ICD-10-CM | POA: Insufficient documentation

## 2022-05-13 DIAGNOSIS — F1210 Cannabis abuse, uncomplicated: Secondary | ICD-10-CM | POA: Insufficient documentation

## 2022-05-13 DIAGNOSIS — T402X1A Poisoning by other opioids, accidental (unintentional), initial encounter: Secondary | ICD-10-CM | POA: Insufficient documentation

## 2022-05-13 DIAGNOSIS — F339 Major depressive disorder, recurrent, unspecified: Secondary | ICD-10-CM

## 2022-05-13 DIAGNOSIS — R4789 Other speech disturbances: Secondary | ICD-10-CM | POA: Insufficient documentation

## 2022-05-13 DIAGNOSIS — F32A Depression, unspecified: Secondary | ICD-10-CM | POA: Diagnosis not present

## 2022-05-13 DIAGNOSIS — F1910 Other psychoactive substance abuse, uncomplicated: Secondary | ICD-10-CM | POA: Diagnosis present

## 2022-05-13 DIAGNOSIS — F332 Major depressive disorder, recurrent severe without psychotic features: Secondary | ICD-10-CM | POA: Insufficient documentation

## 2022-05-13 DIAGNOSIS — T40604A Poisoning by unspecified narcotics, undetermined, initial encounter: Secondary | ICD-10-CM

## 2022-05-13 DIAGNOSIS — F419 Anxiety disorder, unspecified: Secondary | ICD-10-CM | POA: Diagnosis not present

## 2022-05-13 DIAGNOSIS — F1310 Sedative, hypnotic or anxiolytic abuse, uncomplicated: Secondary | ICD-10-CM | POA: Diagnosis not present

## 2022-05-13 DIAGNOSIS — F17210 Nicotine dependence, cigarettes, uncomplicated: Secondary | ICD-10-CM | POA: Diagnosis not present

## 2022-05-13 DIAGNOSIS — Y900 Blood alcohol level of less than 20 mg/100 ml: Secondary | ICD-10-CM | POA: Insufficient documentation

## 2022-05-13 LAB — URINE DRUG SCREEN, QUALITATIVE (ARMC ONLY)
Amphetamines, Ur Screen: NOT DETECTED
Barbiturates, Ur Screen: NOT DETECTED
Benzodiazepine, Ur Scrn: POSITIVE — AB
Cannabinoid 50 Ng, Ur ~~LOC~~: POSITIVE — AB
Cocaine Metabolite,Ur ~~LOC~~: NOT DETECTED
MDMA (Ecstasy)Ur Screen: NOT DETECTED
Methadone Scn, Ur: NOT DETECTED
Opiate, Ur Screen: NOT DETECTED
Phencyclidine (PCP) Ur S: NOT DETECTED
Tricyclic, Ur Screen: NOT DETECTED

## 2022-05-13 LAB — ACETAMINOPHEN LEVEL: Acetaminophen (Tylenol), Serum: <10 ug/mL — ABNORMAL LOW (ref 10–30)

## 2022-05-13 LAB — CBC WITH DIFFERENTIAL/PLATELET
Abs Immature Granulocytes: 0.07 10*3/uL (ref 0.00–0.07)
Basophils Absolute: 0 10*3/uL (ref 0.0–0.1)
Basophils Relative: 0 %
Eosinophils Absolute: 0.1 10*3/uL (ref 0.0–0.5)
Eosinophils Relative: 0 %
HCT: 43.7 % (ref 36.0–46.0)
Hemoglobin: 14.5 g/dL (ref 12.0–15.0)
Immature Granulocytes: 1 %
Lymphocytes Relative: 16 %
Lymphs Abs: 2.2 10*3/uL (ref 0.7–4.0)
MCH: 30.9 pg (ref 26.0–34.0)
MCHC: 33.2 g/dL (ref 30.0–36.0)
MCV: 93.2 fL (ref 80.0–100.0)
Monocytes Absolute: 0.8 10*3/uL (ref 0.1–1.0)
Monocytes Relative: 6 %
Neutro Abs: 10.3 10*3/uL — ABNORMAL HIGH (ref 1.7–7.7)
Neutrophils Relative %: 77 %
Platelets: 292 10*3/uL (ref 150–400)
RBC: 4.69 MIL/uL (ref 3.87–5.11)
RDW: 12.9 % (ref 11.5–15.5)
WBC: 13.4 10*3/uL — ABNORMAL HIGH (ref 4.0–10.5)
nRBC: 0 % (ref 0.0–0.2)

## 2022-05-13 LAB — COMPREHENSIVE METABOLIC PANEL
ALT: 34 U/L (ref 0–44)
AST: 32 U/L (ref 15–41)
Albumin: 3.6 g/dL (ref 3.5–5.0)
Alkaline Phosphatase: 60 U/L (ref 38–126)
Anion gap: 7 (ref 5–15)
BUN: 7 mg/dL (ref 6–20)
CO2: 28 mmol/L (ref 22–32)
Calcium: 8.9 mg/dL (ref 8.9–10.3)
Chloride: 102 mmol/L (ref 98–111)
Creatinine, Ser: 0.67 mg/dL (ref 0.44–1.00)
GFR, Estimated: >60 mL/min (ref >60)
Glucose, Bld: 129 mg/dL — ABNORMAL HIGH (ref 70–99)
Potassium: 3.7 mmol/L (ref 3.5–5.1)
Sodium: 137 mmol/L (ref 135–145)
Total Bilirubin: 0.6 mg/dL (ref 0.3–1.2)
Total Protein: 6.4 g/dL — ABNORMAL LOW (ref 6.5–8.1)

## 2022-05-13 LAB — PREGNANCY, URINE: Preg Test, Ur: NEGATIVE

## 2022-05-13 LAB — SALICYLATE LEVEL: Salicylate Lvl: <7 mg/dL — ABNORMAL LOW (ref 7.0–30.0)

## 2022-05-13 LAB — ETHANOL: Alcohol, Ethyl (B): <10 mg/dL (ref <10)

## 2022-05-13 MED ORDER — ACETAMINOPHEN 500 MG PO TABS
1000.0000 mg | ORAL_TABLET | Freq: Once | ORAL | Status: AC
  Administered 2022-05-13: 1000 mg via ORAL
  Filled 2022-05-13: qty 2
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
## 2022-05-13 NOTE — ED Notes (Signed)
No answer when called several times from lobby 
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
## 2022-05-14 ENCOUNTER — Inpatient Hospital Stay
Admission: RE | Admit: 2022-05-14 | Discharge: 2022-05-16 | DRG: 882 | Disposition: A | Discharge status: Home / Self Care | Payer: 59 | Source: Intra-hospital | Attending: Psychiatry | Admitting: Psychiatry

## 2022-05-14 ENCOUNTER — Other Ambulatory Visit: Payer: Self-pay

## 2022-05-14 DIAGNOSIS — F17210 Nicotine dependence, cigarettes, uncomplicated: Secondary | ICD-10-CM | POA: Diagnosis present

## 2022-05-14 DIAGNOSIS — F1310 Sedative, hypnotic or anxiolytic abuse, uncomplicated: Secondary | ICD-10-CM | POA: Diagnosis present

## 2022-05-14 DIAGNOSIS — Z803 Family history of malignant neoplasm of breast: Secondary | ICD-10-CM | POA: Diagnosis not present

## 2022-05-14 DIAGNOSIS — Z91410 Personal history of adult physical and sexual abuse: Secondary | ICD-10-CM

## 2022-05-14 DIAGNOSIS — F1910 Other psychoactive substance abuse, uncomplicated: Secondary | ICD-10-CM | POA: Diagnosis present

## 2022-05-14 DIAGNOSIS — F1110 Opioid abuse, uncomplicated: Secondary | ICD-10-CM | POA: Insufficient documentation

## 2022-05-14 DIAGNOSIS — F4311 Post-traumatic stress disorder, acute: Principal | ICD-10-CM

## 2022-05-14 DIAGNOSIS — Z8249 Family history of ischemic heart disease and other diseases of the circulatory system: Secondary | ICD-10-CM | POA: Diagnosis not present

## 2022-05-14 DIAGNOSIS — F32A Depression, unspecified: Secondary | ICD-10-CM | POA: Diagnosis not present

## 2022-05-14 DIAGNOSIS — Z91030 Bee allergy status: Secondary | ICD-10-CM

## 2022-05-14 DIAGNOSIS — F419 Anxiety disorder, unspecified: Secondary | ICD-10-CM | POA: Diagnosis present

## 2022-05-14 DIAGNOSIS — Z885 Allergy status to narcotic agent status: Secondary | ICD-10-CM | POA: Diagnosis not present

## 2022-05-14 DIAGNOSIS — G43909 Migraine, unspecified, not intractable, without status migrainosus: Secondary | ICD-10-CM | POA: Diagnosis present

## 2022-05-14 DIAGNOSIS — F332 Major depressive disorder, recurrent severe without psychotic features: Secondary | ICD-10-CM | POA: Diagnosis present

## 2022-05-14 DIAGNOSIS — F331 Major depressive disorder, recurrent, moderate: Secondary | ICD-10-CM | POA: Insufficient documentation

## 2022-05-14 LAB — RESP PANEL BY RT-PCR (RSV, FLU A&B, COVID)  RVPGX2
Influenza A by PCR: NEGATIVE
Influenza B by PCR: NEGATIVE
Resp Syncytial Virus by PCR: NEGATIVE
SARS Coronavirus 2 by RT PCR: NEGATIVE

## 2022-05-14 MED ORDER — HYDROXYZINE HCL 50 MG PO TABS
50.0000 mg | ORAL_TABLET | Freq: Four times a day (QID) | ORAL | Status: DC | PRN
  Administered 2022-05-14: 50 mg via ORAL

## 2022-05-14 MED ORDER — IBUPROFEN 600 MG PO TABS
600.0000 mg | ORAL_TABLET | Freq: Four times a day (QID) | ORAL | Status: DC | PRN
  Administered 2022-05-14: 600 mg via ORAL
  Filled 2022-05-14: qty 1

## 2022-05-14 MED ORDER — ALUM & MAG HYDROXIDE-SIMETH 200-200-20 MG/5ML PO SUSP: 30.0000 mL | ORAL | Status: DC | PRN

## 2022-05-14 MED ORDER — MAGNESIUM HYDROXIDE 400 MG/5ML PO SUSP: 30.0000 mL | Freq: Every day | ORAL | Status: DC | PRN

## 2022-05-14 MED ORDER — SERTRALINE HCL 100 MG PO TABS
100.0000 mg | ORAL_TABLET | Freq: Every day | ORAL | Status: DC
  Administered 2022-05-14 – 2022-05-16 (×3): 100 mg via ORAL
  Filled 2022-05-14 (×3): qty 1
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
## 2022-05-14 NOTE — ED Notes (Signed)
  BED AVAILABLE 05/14/22   Patient is to be admitted to [facility] by Psychiatric Nurse Practitioner Caroline Sauger.  Attending Physician will be Dr.  Weber Cooks .   Patient has been assigned to room 304, by [place] Nurse.

## 2022-05-14 NOTE — ED Notes (Signed)
Report to [facility], RN [facility] BMU. Pt can go to unit at 1030AM
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
## 2022-05-14 NOTE — ED Notes (Signed)
Using phone to speak with mother.

## 2022-05-14 NOTE — ED Notes (Signed)
Attempt to call report unsuccessful. Awaiting call back from [facility] BMU when ready for report on patient.

## 2022-05-14 NOTE — Plan of Care (Signed)
  Problem: Nutrition: Goal: Adequate nutrition will be maintained Outcome: Progressing   Problem: Coping: Goal: Level of anxiety will decrease Outcome: Progressing   
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
## 2022-05-14 NOTE — ED Notes (Signed)
Hospital meal provided.  100% consumed, pt tolerated w/o complaints.  Waste discarded appropriately.   
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
## 2022-05-14 NOTE — Progress Notes (Signed)
Patient stated that she wants to get rid of her headache before completing the admission assessment.

## 2022-05-14 NOTE — BHH Group Notes (Signed)
[facility] Group Notes:  (Nursing/MHT/Case Management/Adjunct)  Date:  05/14/2022  Time:  9:08 PM  Type of Therapy:  Group Therapy  Participation Level:  Did Not Attend  Participation Quality:    Affect:    Cognitive:    Insight:    Engagement in Group:    Modes of Intervention:    Summary of Progress/Problems:  Cassandra Pena 05/14/2022, 9:08 PM

## 2022-05-14 NOTE — BH Assessment (Signed)
BED AVAILABLE 05/14/22  Patient is to be admitted to [facility] by Psychiatric Nurse Practitioner Caroline Sauger.  Attending Physician will be Dr.  Weber Cooks .   Patient has been assigned to room 304, by [place] Nurse.   Intake Paper Work has been signed and placed on patient chart.  ER staff is aware of the admission: [facility] ER Secretary   Dr. Charna Archer, ER MD  Ysidro Evert Patient's Nurse
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
## 2022-05-14 NOTE — Plan of Care (Signed)
  Problem: Education: Goal: Knowledge of General Education information will improve Description: Including pain rating scale, medication(s)/side effects and non-pharmacologic comfort measures Outcome: Progressing   Problem: Nutrition: Goal: Adequate nutrition will be maintained Outcome: Progressing   Problem: Coping: Goal: Coping ability will improve Outcome: Not Progressing   Problem: Self-Concept: Goal: Will verbalize positive feelings about self Outcome: Not Progressing Goal: Level of anxiety will decrease Outcome: Not Progressing

## 2022-05-14 NOTE — ED Notes (Signed)
ivc/consult done/recommend psychiatric admit when medically cleared.Marland Kitchen

## 2022-05-14 NOTE — ED Notes (Signed)
IVC/ EKG faxed to [facility]

## 2022-05-14 NOTE — ED Notes (Addendum)
Pt being admitted to [facility] BMU room 304. Report to [facility], RN in BMU. Taken to unit via wheelchair with [employer] and [employer]. Sent with 2 bags of belongings. Pt alert and oriented X4, cooperative, RR even and unlabored, color WNL. Pt in NAD.
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
## 2022-05-15 DIAGNOSIS — F4311 Post-traumatic stress disorder, acute: Secondary | ICD-10-CM | POA: Diagnosis not present

## 2022-05-15 NOTE — Progress Notes (Signed)
[facility] MD Progress Note  05/15/2022 10:31 AM Cassandra Pena  MRN:  [id number] Subjective: Follow-up patient with acute posttraumatic stress symptoms of depression and anxiety substance abuse.  Patient slept adequately last night.  No specific complaints this morning.  Remains tearful dysphoric and withdrawn but denies suicidal ideation.  Tolerating medicine adequately. Principal Problem: Acute posttraumatic stress disorder Diagnosis: Principal Problem:   Acute posttraumatic stress disorder Active Problems:   Opiate abuse, episodic (HCC)   Benzodiazepine abuse ([place])  Total Time spent with patient: 30 minutes  Past Psychiatric History: Past history of PTSD depression and anxiety and recurrent substance use problems with recent relapse  Past Medical History:  Past Medical History:  Diagnosis Date   Anxiety    Depression    Endometriosis    Migraines    PTSD (post-traumatic stress disorder)     Past Surgical History:  Procedure Laterality Date   CHOLECYSTECTOMY     LEEP     TUBAL LIGATION     Family History:  Family History  Problem Relation Age of Onset   Breast cancer Paternal Aunt 66   Breast cancer Paternal Grandmother    Hypertension Mother    Other Father        unknown medical history   Family Psychiatric  History: See previous Social History:  Social History   Substance and Sexual Activity  Alcohol Use No   Comment: Last ETOH use 03/2022.     Social History   Substance and Sexual Activity  Drug Use Not Currently   Types: Marijuana   Comment: Last marijuana use 10/2021.    Social History   Socioeconomic History   Marital status: Single    Spouse name: Not on file   Number of children: Not on file   Years of education: Not on file   Highest education level: Not on file  Occupational History   Not on file  Tobacco Use   Smoking status: Every Day    Packs/day: 1.00    Types: Cigarettes    Passive exposure: Current   Smokeless tobacco: Never  Vaping  Use   Vaping Use: Never used  Substance and Sexual Activity   Alcohol use: No    Comment: Last ETOH use 03/2022.   Drug use: Not Currently    Types: Marijuana    Comment: Last marijuana use 10/2021.   Sexual activity: Not on file    Comment: BTL 2009  Other Topics Concern   Not on file  Social History Narrative   Not on file   Social Determinants of Health   Financial Resource Strain: Not on file  Food Insecurity: No Food Insecurity (05/14/2022)   Hunger Vital Sign    Worried About Running Out of Food in the Last Year: Never true    Ran Out of Food in the Last Year: Never true  Transportation Needs: No Transportation Needs (05/14/2022)   PRAPARE - Hydrologist (Medical): No    Lack of Transportation (Non-Medical): No  Physical Activity: Not on file  Stress: Not on file  Social Connections: Not on file   Additional Social History:                         Sleep: Fair  Appetite:  Fair  Current Medications: Current Facility-Administered Medications  Medication Dose Route Frequency Provider Last Rate Last Admin   alum & mag hydroxide-simeth (MAALOX/MYLANTA) 846-962-95 MG/5ML suspension 30 mL  30  mL Oral Q4H PRN Caroline Sauger, NP       hydrOXYzine (ATARAX) tablet 50 mg  50 mg Oral Q6H PRN Renny Gunnarson, Madie Reno, MD   50 mg at 05/14/22 1612   ibuprofen (ADVIL) tablet 600 mg  600 mg Oral Q6H PRN Kery Batzel, Madie Reno, MD   600 mg at 05/14/22 1226   magnesium hydroxide (MILK OF MAGNESIA) suspension 30 mL  30 mL Oral Daily PRN Caroline Sauger, NP       sertraline (ZOLOFT) tablet 100 mg  100 mg Oral Daily Preslyn Warr, Madie Reno, MD   100 mg at 05/15/22 9509    Lab Results:  Results for orders placed or performed during the hospital encounter of 05/13/22 (from the past 48 hour(s))  CBC with Differential     Status: Abnormal   Collection Time: 05/13/22  7:31 PM  Result Value Ref Range   WBC 13.4 (H) 4.0 - 10.5 K/uL   RBC 4.69 3.87 - 5.11 MIL/uL    Hemoglobin 14.5 12.0 - 15.0 g/dL   HCT 43.7 36.0 - 46.0 %   MCV 93.2 80.0 - 100.0 fL   MCH 30.9 26.0 - 34.0 pg   MCHC 33.2 30.0 - 36.0 g/dL   RDW 12.9 11.5 - 15.5 %   Platelets 292 150 - 400 K/uL   nRBC 0.0 0.0 - 0.2 %   Neutrophils Relative % 77 %   Neutro Abs 10.3 (H) 1.7 - 7.7 K/uL   Lymphocytes Relative 16 %   Lymphs Abs 2.2 0.7 - 4.0 K/uL   Monocytes Relative 6 %   Monocytes Absolute 0.8 0.1 - 1.0 K/uL   Eosinophils Relative 0 %   Eosinophils Absolute 0.1 0.0 - 0.5 K/uL   Basophils Relative 0 %   Basophils Absolute 0.0 0.0 - 0.1 K/uL   Immature Granulocytes 1 %   Abs Immature Granulocytes 0.07 0.00 - 0.07 K/uL    Comment: Performed at [facility], [address] [place], [place] [address]  Comprehensive metabolic panel     Status: Abnormal   Collection Time: 05/13/22  7:31 PM  Result Value Ref Range   Sodium 137 135 - 145 mmol/L   Potassium 3.7 3.5 - 5.1 mmol/L   Chloride 102 98 - 111 mmol/L   CO2 28 22 - 32 mmol/L   Glucose, Bld 129 (H) 70 - 99 mg/dL    Comment: Glucose reference range applies only to samples taken after fasting for at least 8 hours.   BUN 7 6 - 20 mg/dL   Creatinine, Ser 0.67 0.44 - 1.00 mg/dL   Calcium 8.9 8.9 - 10.3 mg/dL   Total Protein 6.4 (L) 6.5 - 8.1 g/dL   Albumin 3.6 3.5 - 5.0 g/dL   AST 32 15 - 41 U/L   ALT 34 0 - 44 U/L   Alkaline Phosphatase 60 38 - 126 U/L   Total Bilirubin 0.6 0.3 - 1.2 mg/dL   GFR, Estimated >60 >60 mL/min    Comment: (NOTE) Calculated using the CKD-EPI Creatinine Equation (2021)    Anion gap 7 5 - 15    Comment: Performed at [facility], [address] [place], [place] [address]  Salicylate level     Status: Abnormal   Collection Time: 05/13/22  7:31 PM  Result Value Ref Range   Salicylate Lvl <9.9 (L) 7.0 - 30.0 mg/dL    Comment: Performed at [facility], [address], [place], [place] [address]  Acetaminophen level  Status: Abnormal   Collection Time: 05/13/22  7:31 PM   Result Value Ref Range   Acetaminophen (Tylenol), Serum <10 (L) 10 - 30 ug/mL    Comment: (NOTE) Therapeutic concentrations vary significantly. A range of 10-30 ug/mL  may be an effective concentration for many patients. However, some  are best treated at concentrations outside of this range. Acetaminophen concentrations >150 ug/mL at 4 hours after ingestion  and >50 ug/mL at 12 hours after ingestion are often associated with  toxic reactions.  Performed at [facility], [address] [place], [place] [address]   Ethanol     Status: None   Collection Time: 05/13/22  7:31 PM  Result Value Ref Range   Alcohol, Ethyl (B) <10 <10 mg/dL    Comment: (NOTE) Lowest detectable limit for serum alcohol is 10 mg/dL.  For medical purposes only. Performed at [facility], [address] [place], [place] [address]   Urine Drug Screen, Qualitative     Status: Abnormal   Collection Time: 05/13/22 10:53 PM  Result Value Ref Range   Tricyclic, Ur Screen NONE DETECTED NONE DETECTED   Amphetamines, Ur Screen NONE DETECTED NONE DETECTED   MDMA (Ecstasy)Ur Screen NONE DETECTED NONE DETECTED   Cocaine Metabolite,Ur [place] NONE DETECTED NONE DETECTED   Opiate, Ur Screen NONE DETECTED NONE DETECTED   Phencyclidine (PCP) Ur S NONE DETECTED NONE DETECTED   Cannabinoid 50 Ng, Ur [place] POSITIVE (A) NONE DETECTED   Barbiturates, Ur Screen NONE DETECTED NONE DETECTED   Benzodiazepine, Ur Scrn POSITIVE (A) NONE DETECTED   Methadone Scn, Ur NONE DETECTED NONE DETECTED    Comment: (NOTE) Tricyclics + metabolites, urine    Cutoff 1000 ng/mL Amphetamines + metabolites, urine  Cutoff 1000 ng/mL MDMA (Ecstasy), urine              Cutoff 500 ng/mL Cocaine Metabolite, urine          Cutoff 300 ng/mL Opiate + metabolites, urine        Cutoff 300 ng/mL Phencyclidine (PCP), urine         Cutoff 25 ng/mL Cannabinoid, urine                 Cutoff 50 ng/mL Barbiturates + metabolites, urine  Cutoff  200 ng/mL Benzodiazepine, urine              Cutoff 200 ng/mL Methadone, urine                   Cutoff 300 ng/mL  The urine drug screen provides only a preliminary, unconfirmed analytical test result and should not be used for non-medical purposes. Clinical consideration and professional judgment should be applied to any positive drug screen result due to possible interfering substances. A more specific alternate chemical method must be used in order to obtain a confirmed analytical result. Gas chromatography / mass spectrometry (GC/MS) is the preferred confirm atory method. Performed at [facility], [address] [place], [place] [address]   Pregnancy, urine     Status: None   Collection Time: 05/13/22 10:53 PM  Result Value Ref Range   Preg Test, Ur NEGATIVE NEGATIVE    Comment: Performed at [facility], [address] [place], [place] [address]  Resp panel by RT-PCR (RSV, Flu A&B, Covid) Anterior Nasal Swab     Status: None   Collection Time: 05/13/22 11:34 PM   Specimen: Anterior Nasal Swab  Result Value Ref Range   SARS Coronavirus  2 by RT PCR NEGATIVE NEGATIVE    Comment: (NOTE) SARS-CoV-2 target nucleic acids are NOT DETECTED.  The SARS-CoV-2 RNA is generally detectable in upper respiratory specimens during the acute phase of infection. The lowest concentration of SARS-CoV-2 viral copies this assay can detect is 138 copies/mL. A negative result does not preclude SARS-Cov-2 infection and should not be used as the sole basis for treatment or other patient management decisions. A negative result may occur with  improper specimen collection/handling, submission of specimen other than nasopharyngeal swab, presence of viral mutation(s) within the areas targeted by this assay, and inadequate number of viral copies(<138 copies/mL). A negative result must be combined with clinical observations, patient history, and epidemiological information. The  expected result is Negative.  Fact Sheet for Patients:  BloggerCourse.com  Fact Sheet for Healthcare Providers:  SeriousBroker.it  This test is no t yet approved or cleared by the [place] FDA and  has been authorized for detection and/or diagnosis of SARS-CoV-2 by FDA under an Emergency Use Authorization (EUA). This EUA will remain  in effect (meaning this test can be used) for the duration of the COVID-19 declaration under Section 564(b)(1) of the Act, 21 U.S.C.section 360bbb-3(b)(1), unless the authorization is terminated  or revoked sooner.       Influenza A by PCR NEGATIVE NEGATIVE   Influenza B by PCR NEGATIVE NEGATIVE    Comment: (NOTE) The Xpert Xpress SARS-CoV-2/FLU/RSV plus assay is intended as an aid in the diagnosis of influenza from Nasopharyngeal swab specimens and should not be used as a sole basis for treatment. Nasal washings and aspirates are unacceptable for Xpert Xpress SARS-CoV-2/FLU/RSV testing.  Fact Sheet for Patients: BloggerCourse.com  Fact Sheet for Healthcare Providers: SeriousBroker.it  This test is not yet approved or cleared by the [place] FDA and has been authorized for detection and/or diagnosis of SARS-CoV-2 by FDA under an Emergency Use Authorization (EUA). This EUA will remain in effect (meaning this test can be used) for the duration of the COVID-19 declaration under Section 564(b)(1) of the Act, 21 U.S.C. section 360bbb-3(b)(1), unless the authorization is terminated or revoked.     Resp Syncytial Virus by PCR NEGATIVE NEGATIVE    Comment: (NOTE) Fact Sheet for Patients: BloggerCourse.com  Fact Sheet for Healthcare Providers: SeriousBroker.it  This test is not yet approved or cleared by the [place] FDA and has been authorized for detection and/or diagnosis of  SARS-CoV-2 by FDA under an Emergency Use Authorization (EUA). This EUA will remain in effect (meaning this test can be used) for the duration of the COVID-19 declaration under Section 564(b)(1) of the Act, 21 U.S.C. section 360bbb-3(b)(1), unless the authorization is terminated or revoked.  Performed at [facility], [address] [place], [place] [address]     Blood Alcohol level:  Lab Results  Component Value Date   [facility] <10 05/13/2022   ETH <10 09/01/2021    Metabolic Disorder Labs: No results found for: "HGBA1C", "MPG" No results found for: "PROLACTIN" No results found for: "CHOL", "TRIG", "HDL", "CHOLHDL", "VLDL", "LDLCALC"  Physical Findings: AIMS:  , ,  ,  ,    CIWA:    COWS:     Musculoskeletal: Strength & Muscle Tone: within normal limits Gait & Station: normal Patient leans: N/A  Psychiatric Specialty Exam:  Presentation  General Appearance:  Appropriate for Environment  Eye Contact: Minimal  Speech: Clear and Coherent  Speech Volume: Normal  Handedness: Right   Mood and Affect  Mood: Irritable  Affect: Depressed; Inappropriate; Restricted; Tearful   Thought Process  Thought Processes: Coherent  Descriptions of Associations:Intact  Orientation:Full (Time, Place and Person)  Thought Content:Logical  History of Schizophrenia/Schizoaffective disorder:No  Duration of Psychotic Symptoms:No data recorded Hallucinations:No data recorded Ideas of Reference:None  Suicidal Thoughts:No data recorded Homicidal Thoughts:No data recorded  Sensorium  Memory: Immediate Good; Recent Good; Remote Good  Judgment: Poor  Insight: Poor   Executive Functions  Concentration: Fair  Attention Span: Fair  Recall: [place] of Knowledge: Fair  Language: Fair   Psychomotor Activity  Psychomotor Activity:No data recorded  Assets  Assets: Communication Skills; Desire for Improvement; Resilience; Social Support;  Intimacy   Sleep  Sleep:No data recorded   Physical Exam: Physical Exam Vitals reviewed.  Constitutional:      Appearance: Normal appearance.  HENT:     Head: Normocephalic and atraumatic.     Mouth/Throat:     Pharynx: Oropharynx is clear.  Eyes:     Pupils: Pupils are equal, round, and reactive to light.  Cardiovascular:     Rate and Rhythm: Normal rate and regular rhythm.  Pulmonary:     Effort: Pulmonary effort is normal.     Breath sounds: Normal breath sounds.  Abdominal:     General: Abdomen is flat.     Palpations: Abdomen is soft.  Musculoskeletal:        General: Normal range of motion.  Skin:    General: Skin is warm and dry.  Neurological:     General: No focal deficit present.     Mental Status: She is alert. Mental status is at baseline.  Psychiatric:        Attention and Perception: Attention normal.        Mood and Affect: Mood is depressed. Affect is blunt and tearful.        Speech: Speech is delayed.        Behavior: Behavior is slowed.        Thought Content: Thought content normal.        Cognition and Memory: Cognition normal.    Review of Systems  Constitutional: Negative.   HENT: Negative.    Eyes: Negative.   Respiratory: Negative.    Cardiovascular: Negative.   Gastrointestinal: Negative.   Musculoskeletal: Negative.   Skin: Negative.   Neurological: Negative.   Psychiatric/Behavioral:  Positive for depression. Negative for suicidal ideas. The patient is not nervous/anxious.    Blood pressure (!) 154/87, pulse 60, temperature 98.3 F (36.8 C), temperature source Oral, resp. rate 18, height 5\' 7"  (1.702 m), weight 101.2 kg, last menstrual period 04/27/2022, SpO2 90 %. Body mass index is 34.93 kg/m.   Treatment Plan Summary: Medication management and Plan no change to medication.  Reminded patient that triptans were ordered if needed for migraine headaches.  Blood pressure today slightly elevated but no indication at this point to  change medicine.  Encourage patient to talk to staff if there are any new needs.  Reassess tomorrow.  Alethia Berthold, MD 05/15/2022, 10:31 AM

## 2022-05-15 NOTE — Plan of Care (Signed)
  Problem: Nutrition: Goal: Adequate nutrition will be maintained Outcome: Progressing   Problem: Coping: Goal: Level of anxiety will decrease Outcome: Progressing   Problem: Coping: Goal: Coping ability will improve Outcome: Progressing Goal: Will verbalize feelings Outcome: Progressing

## 2022-05-15 NOTE — Progress Notes (Signed)
Cassandra Pena remains isolative to her room since the start of the shift. Staff went to her room to see If she was interested in snack before the food was put up for the night. Azyiah did get up for snack, but promptly returned to her room. She denies si/hi/avh at this encounter, but does endorse having depression and anxiety related to her life stressors. She denies the need for medication to hep her sleep. Encouraged her to seek staff with any questions or concerns that she may have.      C. Butler-Nicholson,LPN
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
## 2022-05-15 NOTE — Plan of Care (Signed)
  Problem: Education: Goal: Knowledge of General Education information will improve Description: Including pain rating scale, medication(s)/side effects and non-pharmacologic comfort measures Outcome: Not Progressing   Problem: Health Behavior/Discharge Planning: Goal: Ability to manage health-related needs will improve Outcome: Not Progressing   Problem: Clinical Measurements: Goal: Ability to maintain clinical measurements within normal limits will improve Outcome: Not Progressing Goal: Will remain free from infection Outcome: Not Progressing Goal: Diagnostic test results will improve Outcome: Not Progressing Goal: Respiratory complications will improve Outcome: Not Progressing Goal: Cardiovascular complication will be avoided Outcome: Not Progressing   Problem: Activity: Goal: Risk for activity intolerance will decrease Outcome: Not Progressing   Problem: Nutrition: Goal: Adequate nutrition will be maintained Outcome: Not Progressing   Problem: Coping: Goal: Level of anxiety will decrease Outcome: Not Progressing   Problem: Elimination: Goal: Will not experience complications related to bowel motility Outcome: Not Progressing Goal: Will not experience complications related to urinary retention Outcome: Not Progressing   Problem: Pain Managment: Goal: General experience of comfort will improve Outcome: Not Progressing   Problem: Safety: Goal: Ability to remain free from injury will improve Outcome: Not Progressing   Problem: Skin Integrity: Goal: Risk for impaired skin integrity will decrease Outcome: Not Progressing   Problem: Education: Goal: Ability to state activities that reduce stress will improve Outcome: Not Progressing   Problem: Coping: Goal: Ability to identify and develop effective coping behavior will improve Outcome: Not Progressing   Problem: Self-Concept: Goal: Ability to identify factors that promote anxiety will improve Outcome: Not  Progressing Goal: Level of anxiety will decrease Outcome: Not Progressing Goal: Ability to modify response to factors that promote anxiety will improve Outcome: Not Progressing   Problem: Education: Goal: Utilization of techniques to improve thought processes will improve Outcome: Not Progressing Goal: Knowledge of the prescribed therapeutic regimen will improve Outcome: Not Progressing   Problem: Activity: Goal: Interest or engagement in leisure activities will improve Outcome: Not Progressing Goal: Imbalance in normal sleep/wake cycle will improve Outcome: Not Progressing   Problem: Coping: Goal: Coping ability will improve Outcome: Not Progressing Goal: Will verbalize feelings Outcome: Not Progressing   Problem: Health Behavior/Discharge Planning: Goal: Ability to make decisions will improve Outcome: Not Progressing Goal: Compliance with therapeutic regimen will improve Outcome: Not Progressing   Problem: Role Relationship: Goal: Will demonstrate positive changes in social behaviors and relationships Outcome: Not Progressing   Problem: Safety: Goal: Ability to disclose and discuss suicidal ideas will improve Outcome: Not Progressing Goal: Ability to identify and utilize support systems that promote safety will improve Outcome: Not Progressing   Problem: Self-Concept: Goal: Will verbalize positive feelings about self Outcome: Not Progressing Goal: Level of anxiety will decrease Outcome: Not Progressing

## 2022-05-16 DIAGNOSIS — F4311 Post-traumatic stress disorder, acute: Secondary | ICD-10-CM | POA: Diagnosis not present

## 2022-05-16 MED ORDER — HYDROXYZINE HCL 50 MG PO TABS: 50.0000 mg | ORAL_TABLET | Freq: Four times a day (QID) | ORAL | 0 refills | Status: AC | PRN

## 2022-05-16 MED ORDER — SERTRALINE HCL 100 MG PO TABS: 100.0000 mg | ORAL_TABLET | Freq: Every day | ORAL | 1 refills | Status: AC

## 2022-05-16 NOTE — Progress Notes (Signed)
Discharge note: SSP and survey complete. RN met with pt and reviewed pt's discharge instructions. Pt verbalized understanding of discharge instructions and pt did not have any questions. RN reviewed and provided pt with a copy of SRA, AVS and Transition Record. RN returned pt's belongings to pt. Prescriptions were given to pt. Pt denied SI/HI/AVH and voiced no concerns. Pt was appreciative of the care pt received at [facility]. Patient discharged to their ride without incident.  05/16/22 0825  Psych Admission Type (Psych Patients Only)  Admission Status Involuntary  Psychosocial Assessment  Patient Complaints Depression  Eye Contact Fair  Facial Expression Sad  Affect Depressed  Speech Logical/coherent;Soft  Interaction Assertive;Isolative  Motor Activity Slow  Appearance/Hygiene Unremarkable;In scrubs  Behavior Characteristics Cooperative;Appropriate to situation;Calm  Mood Depressed;Sad;Pleasant  Aggressive Behavior  Effect No apparent injury  Thought Process  Coherency WDL  Content WDL  Delusions None reported or observed  Perception WDL  Hallucination None reported or observed  Judgment Impaired  Confusion None  Danger to Self  Current suicidal ideation? Denies  Danger to Others  Danger to Others None reported or observed

## 2022-05-16 NOTE — Plan of Care (Signed)
  Problem: Coping: Goal: Level of anxiety will decrease Outcome: Progressing   Problem: Coping: Goal: Ability to identify and develop effective coping behavior will improve Outcome: Progressing   Problem: Self-Concept: Goal: Ability to identify factors that promote anxiety will improve Outcome: Progressing   Problem: Coping: Goal: Will verbalize feelings Outcome: Progressing

## 2022-05-16 NOTE — Progress Notes (Signed)
Staff took list of food items Cassandra Pena wanted to eat for snack. Took requested items to her room for her to eat. She is pleasant but very tearful this evening. She is still hesitant to engage with her peers on the unit.  She reports that it is mostly males in the dayroom and they can be loud and boisterous which makes her uncomfortable at this time. Staff offered encouragement and support regarding the situation and advised not to do anything that would cause her to be fearful.She has no qhs medication scheduled and she stated that she did not need any meds to help her with mood or sleep.  Q 15 minute safety checks in place.  Encouraged [name] to come to the nurses station with any concerns.     C Butler-Nicholson, LPN
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
## 2022-05-16 NOTE — Progress Notes (Signed)
  [facility] Adult Case Management Discharge Plan :  Will you be returning to the same living situation after discharge:  Yes,  her home At discharge, do you have transportation home?: Yes,  her mother York Pellant Do you have the ability to pay for your medications: Yes,  has insurance  Release of information consent forms completed and in the chart;  Patient's signature needed at discharge.  Patient to Follow up at:   Next level of care provider has access to [place] and Suicide Prevention discussed: Yes,  mother York Pellant     Has patient been referred to the Quitline?: Patient refused referral  Patient has been referred for addiction treatment: Pt. refused referral  Lubertha South, LCSW 05/16/2022, 1:02 PM
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
## 2022-05-16 NOTE — BHH Group Notes (Signed)
[place] Group Notes:  (Nursing/MHT/Case Management/Adjunct)  Date:  05/16/2022  Time:  10:01 AM  Type of Therapy:   community meeting  Participation Level:  Did Not Attend    Antonieta Pert 05/16/2022, 10:01 AM

## 2022-06-26 ENCOUNTER — Ambulatory Visit
Admission: EM | Admit: 2022-06-26 | Discharge: 2022-06-26 | Disposition: A | Discharge status: Home / Self Care | Payer: BLUE CROSS/BLUE SHIELD | Attending: Family Medicine | Admitting: Family Medicine

## 2022-06-26 DIAGNOSIS — R221 Localized swelling, mass and lump, neck: Secondary | ICD-10-CM | POA: Diagnosis not present

## 2022-06-26 DIAGNOSIS — J039 Acute tonsillitis, unspecified: Secondary | ICD-10-CM | POA: Diagnosis not present

## 2022-06-26 LAB — GROUP A STREP BY PCR: Group A Strep by PCR: DETECTED — AB

## 2022-06-26 MED ORDER — PREDNISONE 20 MG PO TABS: 40.0000 mg | ORAL_TABLET | Freq: Every day | ORAL | 0 refills | Status: AC

## 2022-06-26 MED ORDER — LIDOCAINE VISCOUS HCL 2 % MT SOLN: 15.0000 mL | OROMUCOSAL | 0 refills | Status: DC | PRN

## 2022-06-26 MED ORDER — AMOXICILLIN-POT CLAVULANATE 875-125 MG PO TABS: 1.0000 | ORAL_TABLET | Freq: Two times a day (BID) | ORAL | 0 refills | Status: AC

## 2022-06-26 NOTE — ED Triage Notes (Signed)
Pt c/o sore throat x3days throat swelling and left side ear pain x1day  Pt has used chloraseptic spray for her throat swelling.

## 2022-06-26 NOTE — Discharge Instructions (Signed)
-  Pending strep test.  I will call you if negative I suspect that it would be positive. - I sent antibiotics, corticosteroids and viscous lidocaine to pharmacy.  Continue with Chloraseptic and Tylenol if able to take Tylenol.  Increase rest and fluids. - If you are not feeling better over the next couple days or if at any point your symptoms worsen or you develop fever or have difficulty managing your saliva he should go to the emergency department.  You have significant swelling of your throat and tonsils so if at any point the swelling worsens or you are feeling worse and not better, go to the ER.

## 2022-06-26 NOTE — ED Provider Notes (Signed)
[facility]    CSN: [id number] Arrival date & time: 06/26/22  1418      History   Chief Complaint Chief Complaint  Patient presents with   Oral Swelling   Sore Throat    HPI Cassandra Pena is a 42 y.o. female presenting for sore throat, painful swallowing, and left sided ear pain x 3 days. Denies fever, cough, congestion, body aches, SOB, vomiting, diarrhea. No known sick contacts. Reports loss of appetite and difficulty swallowing solids. Tried chloraseptic spray for symptoms without relief. No other complaints or concerns.  HPI  Past Medical History:  Diagnosis Date   Anxiety    Depression    Endometriosis    Migraines    PTSD (post-traumatic stress disorder)     Patient Active Problem List   Diagnosis Date Noted   MDD (major depressive disorder), recurrent episode, moderate ([place]) 05/14/2022   Substance abuse ([place]) 05/14/2022   Depression 05/14/2022   Acute posttraumatic stress disorder 05/14/2022   Opiate abuse, episodic ([place]) 05/14/2022   Benzodiazepine abuse ([place]) 05/14/2022    Past Surgical History:  Procedure Laterality Date   CHOLECYSTECTOMY     LEEP     TUBAL LIGATION      OB History   No obstetric history on file.      Home Medications    Prior to Admission medications   Medication Sig Start Date End Date Taking? Authorizing Provider  amoxicillin-clavulanate (AUGMENTIN) 875-125 MG tablet Take 1 tablet by mouth every 12 (twelve) hours for 10 days. 06/26/22 07/06/22 Yes Danton Clap, PA-C  hydrOXYzine (ATARAX) 50 MG tablet Take 1 tablet (50 mg total) by mouth every 6 (six) hours as needed for anxiety. 05/16/22  Yes Clapacs, Madie Reno, MD  lidocaine (XYLOCAINE) 2 % solution Use as directed 15 mLs in the mouth or throat every 3 (three) hours as needed for mouth pain (swish and spit). 06/26/22  Yes Danton Clap, PA-C  predniSONE (DELTASONE) 20 MG tablet Take 2 tablets (40 mg total) by mouth daily for 5 days. 06/26/22 07/01/22 Yes Laurene Footman B, PA-C  sertraline (ZOLOFT) 100 MG tablet Take 1 tablet (100 mg total) by mouth daily. 05/17/22  Yes Clapacs, Madie Reno, MD  SUMAtriptan (IMITREX) 50 MG tablet Take 1 tablet (50 mg total) by mouth once for 1 dose. May repeat in 2 hours if headache persists or recurs. 10/10/19 10/07/20  Coral Spikes, DO  fluticasone (FLONASE) 50 MCG/ACT nasal spray Place 2 sprays into both nostrils daily. 06/25/18 12/01/18  Melynda Ripple, MD    Family History Family History  Problem Relation Age of Onset   Breast cancer Paternal Aunt 38   Breast cancer Paternal Grandmother    Hypertension Mother    Other Father        unknown medical history    Social History Social History   Tobacco Use   Smoking status: Every Day    Packs/day: 1    Types: Cigarettes    Passive exposure: Current   Smokeless tobacco: Never  Vaping Use   Vaping Use: Never used  Substance Use Topics   Alcohol use: No    Comment: Last ETOH use 03/2022.   Drug use: Not Currently    Types: Marijuana    Comment: Last marijuana use 10/2021.     Allergies   Bee venom, Hydrocodone-acetaminophen, and Vicodin [hydrocodone-acetaminophen]   Review of Systems Review of Systems  Constitutional:  Positive for appetite change and fatigue. Negative for chills,  diaphoresis and fever.  HENT:  Positive for ear pain, sore throat and trouble swallowing. Negative for congestion, rhinorrhea, sinus pressure and sinus pain.   Respiratory:  Negative for cough and shortness of breath.   Gastrointestinal:  Negative for abdominal pain, nausea and vomiting.  Musculoskeletal:  Negative for arthralgias and myalgias.  Skin:  Negative for rash.  Neurological:  Negative for weakness and headaches.  Hematological:  Negative for adenopathy.     Physical Exam Triage Vital Signs ED Triage Vitals  Enc Vitals Group     BP      Pulse      Resp      Temp      Temp src      SpO2      Weight      Height      Head Circumference      Peak Flow       Pain Score      Pain Loc      Pain Edu?      Excl. in [place]?    No data found.  Updated Vital Signs BP (!) 121/93 (BP Location: Left Arm)   Pulse 85   Temp 98.6 F (37 C) (Oral)   Ht 5\' 8"  (1.727 m)   Wt 200 lb (90.7 kg)   LMP 06/26/2022   SpO2 99%   BMI 30.41 kg/m   Physical Exam Vitals and nursing note reviewed.  Constitutional:      General: She is not in acute distress.    Appearance: Normal appearance. She is ill-appearing. She is not toxic-appearing.  HENT:     Head: Normocephalic and atraumatic.     Right Ear: Tympanic membrane, ear canal and external ear normal.     Left Ear: Tympanic membrane, ear canal and external ear normal.     Nose: Nose normal.     Mouth/Throat:     Mouth: Mucous membranes are moist.     Pharynx: Oropharynx is clear. Posterior oropharyngeal erythema present.     Tonsils: 3+ on the right. 3+ on the left.     Comments: +moderate uvular swelling Eyes:     General: No scleral icterus.       Right eye: No discharge.        Left eye: No discharge.     Conjunctiva/sclera: Conjunctivae normal.  Cardiovascular:     Rate and Rhythm: Normal rate and regular rhythm.     Heart sounds: Normal heart sounds.  Pulmonary:     Effort: Pulmonary effort is normal. No respiratory distress.     Breath sounds: Normal breath sounds.  Musculoskeletal:     Cervical back: Neck supple.  Lymphadenopathy:     Cervical: Cervical adenopathy present.  Skin:    General: Skin is dry.  Neurological:     General: No focal deficit present.     Mental Status: She is alert. Mental status is at baseline.     Motor: No weakness.     Gait: Gait normal.  Psychiatric:        Mood and Affect: Mood normal.        Behavior: Behavior normal.        Thought Content: Thought content normal.      UC Treatments / Results  Labs (all labs ordered are listed, but only abnormal results are displayed) Labs Reviewed  GROUP A STREP BY PCR    EKG   Radiology No results  found.  Procedures Procedures (including critical care  time)  Medications Ordered in UC Medications - No data to display  Initial Impression / Assessment and Plan / UC Course  I have reviewed the triage vital signs and the nursing notes.  Pertinent labs & imaging results that were available during my care of the patient were reviewed by me and considered in my medical decision making (see chart for details).   42 y/o female presents for sore throat and left sided ear pain x 3 days.  On exam patient has significant posterior pharyngeal erythema and uvular erythema.  Swelling of tonsils 3+ and moderate swelling of uvula.  Also tender and enlarged anterior cervical lymph nodes.  She is ill-appearing but nontoxic.  PCR strep testing obtained.   High suspicion for strep so I am going to go ahead and treat patient.  Sent Augmentin to pharmacy as well as prednisone and viscous lidocaine.  Advised her that I will contact her with the results if she is negative for strep but I intend to treat her the same way either way given the amount of swelling she has in her throat.  Supportive care advised with use of OTC ibuprofen, rest and fluids as well as cough drops and chloraseptic spray. Reviewed return as needed if not feeling better over  2 days or for any worsening symptoms.  +strep.   Final Clinical Impressions(s) / UC Diagnoses   Final diagnoses:  Acute tonsillitis, unspecified etiology  Throat swelling     Discharge Instructions      -Pending strep test.  I will call you if negative I suspect that it would be positive. - I sent antibiotics, corticosteroids and viscous lidocaine to pharmacy.  Continue with Chloraseptic and Tylenol if able to take Tylenol.  Increase rest and fluids. - If you are not feeling better over the next couple days or if at any point your symptoms worsen or you develop fever or have difficulty managing your saliva he should go to the emergency department.  You  have significant swelling of your throat and tonsils so if at any point the swelling worsens or you are feeling worse and not better, go to the ER.     ED Prescriptions     Medication Sig Dispense Auth. Provider   amoxicillin-clavulanate (AUGMENTIN) 875-125 MG tablet Take 1 tablet by mouth every 12 (twelve) hours for 10 days. 20 tablet Laurene Footman B, PA-C   predniSONE (DELTASONE) 20 MG tablet Take 2 tablets (40 mg total) by mouth daily for 5 days. 10 tablet Laurene Footman B, PA-C   lidocaine (XYLOCAINE) 2 % solution Use as directed 15 mLs in the mouth or throat every 3 (three) hours as needed for mouth pain (swish and spit). 100 mL Danton Clap, PA-C      PDMP not reviewed this encounter.   Danton Clap, PA-C 06/26/22 [phone]

## 2022-07-09 ENCOUNTER — Ambulatory Visit
Admission: EM | Admit: 2022-07-09 | Discharge: 2022-07-09 | Disposition: A | Discharge status: Home / Self Care | Payer: BLUE CROSS/BLUE SHIELD | Attending: Emergency Medicine | Admitting: Emergency Medicine

## 2022-07-09 DIAGNOSIS — J020 Streptococcal pharyngitis: Secondary | ICD-10-CM | POA: Diagnosis not present

## 2022-07-09 LAB — GROUP A STREP BY PCR: Group A Strep by PCR: DETECTED — AB

## 2022-07-09 MED ORDER — NYSTATIN 100000 UNIT/ML MT SUSP: 5.0000 mL | Freq: Four times a day (QID) | OROMUCOSAL | 0 refills | Status: DC | PRN

## 2022-07-09 MED ORDER — PREDNISONE 50 MG PO TABS: 50.0000 mg | ORAL_TABLET | Freq: Every day | ORAL | 0 refills | Status: AC

## 2022-07-09 MED ORDER — CEFDINIR 300 MG PO CAPS: 300.0000 mg | ORAL_CAPSULE | Freq: Two times a day (BID) | ORAL | 0 refills | Status: AC

## 2022-07-09 MED ORDER — DEXAMETHASONE SODIUM PHOSPHATE 10 MG/ML IJ SOLN
10.0000 mg | Freq: Once | INTRAMUSCULAR | Status: AC
  Administered 2022-07-09: 10 mg via INTRAMUSCULAR

## 2022-07-09 NOTE — ED Triage Notes (Signed)
Pt c/o sore throat, swelling along left side of neck. X2days  Pt was seen on 06/26/2022 for similar symptoms.   Pt completed amoxicillin and prednisone from last visit and finished on 06/26/22.  Pt states that the same symptoms returned today.

## 2022-07-09 NOTE — ED Provider Notes (Signed)
[facility]    CSN: [id number] Arrival date & time: 07/09/22  0908      History   Chief Complaint Chief Complaint  Patient presents with   Sore Throat    HPI Cassandra Pena is a 42 y.o. female.   Presents for sore throat and swelling along the left side of the neck beginning this morning.  Symptoms worsening this morning, having difficulty tolerating food and liquids and painful to talk.  Completed amoxicillin 3 days prior for treatment of strep.  Denies any fever.  Past Medical History:  Diagnosis Date   Anxiety    Depression    Endometriosis    Migraines    PTSD (post-traumatic stress disorder)     Patient Active Problem List   Diagnosis Date Noted   MDD (major depressive disorder), recurrent episode, moderate ([place]) 05/14/2022   Substance abuse ([place]) 05/14/2022   Depression 05/14/2022   Acute posttraumatic stress disorder 05/14/2022   Opiate abuse, episodic ([place]) 05/14/2022   Benzodiazepine abuse ([place]) 05/14/2022    Past Surgical History:  Procedure Laterality Date   CHOLECYSTECTOMY     LEEP     TUBAL LIGATION      OB History   No obstetric history on file.      Home Medications    Prior to Admission medications   Medication Sig Start Date End Date Taking? Authorizing Provider  hydrOXYzine (ATARAX) 50 MG tablet Take 1 tablet (50 mg total) by mouth every 6 (six) hours as needed for anxiety. 05/16/22  Yes Clapacs, Madie Reno, MD  lidocaine (XYLOCAINE) 2 % solution Use as directed 15 mLs in the mouth or throat every 3 (three) hours as needed for mouth pain (swish and spit). 06/26/22  Yes Laurene Footman B, PA-C  sertraline (ZOLOFT) 100 MG tablet Take 1 tablet (100 mg total) by mouth daily. 05/17/22  Yes Clapacs, Madie Reno, MD  SUMAtriptan (IMITREX) 50 MG tablet Take 1 tablet (50 mg total) by mouth once for 1 dose. May repeat in 2 hours if headache persists or recurs. 10/10/19 10/07/20  Coral Spikes, DO  fluticasone (FLONASE) 50 MCG/ACT nasal spray Place 2  sprays into both nostrils daily. 06/25/18 12/01/18  Melynda Ripple, MD    Family History Family History  Problem Relation Age of Onset   Breast cancer Paternal Aunt 58   Breast cancer Paternal Grandmother    Hypertension Mother    Other Father        unknown medical history    Social History Social History   Tobacco Use   Smoking status: Every Day    Packs/day: 1    Types: Cigarettes    Passive exposure: Current   Smokeless tobacco: Never  Vaping Use   Vaping Use: Never used  Substance Use Topics   Alcohol use: No    Comment: Last ETOH use 03/2022.   Drug use: Not Currently    Types: Marijuana    Comment: Last marijuana use 10/2021.     Allergies   Bee venom, Hydrocodone-acetaminophen, and Vicodin [hydrocodone-acetaminophen]   Review of Systems Review of Systems   Physical Exam Triage Vital Signs ED Triage Vitals  Enc Vitals Group     BP 07/09/22 0923 (!) 133/93     Pulse Rate 07/09/22 0923 83     Resp --      Temp 07/09/22 0923 98.5 F (36.9 C)     Temp Source 07/09/22 0923 Oral     SpO2 07/09/22 0923 96 %  Weight 07/09/22 0920 206 lb (93.4 kg)     Height 07/09/22 0920 5\' 8"  (1.727 m)     Head Circumference --      Peak Flow --      Pain Score 07/09/22 0919 8     Pain Loc --      Pain Edu? --      Excl. in [place]? --    No data found.  Updated Vital Signs BP (!) 133/93 (BP Location: Left Arm)   Pulse 83   Temp 98.5 F (36.9 C) (Oral)   Ht 5\' 8"  (1.727 m)   Wt 206 lb (93.4 kg)   LMP 06/26/2022   SpO2 96%   BMI 31.32 kg/m   Visual Acuity Right Eye Distance:   Left Eye Distance:   Bilateral Distance:    Right Eye Near:   Left Eye Near:    Bilateral Near:     Physical Exam Constitutional:      Appearance: She is well-developed.  HENT:     Mouth/Throat:     Mouth: Mucous membranes are moist.     Pharynx: Uvula midline. Posterior oropharyngeal erythema present.     Tonsils: No tonsillar exudate. 3+ on the right. 3+ on the left.   Pulmonary:     Effort: Pulmonary effort is normal.  Neurological:     Mental Status: She is alert and oriented to person, place, and time.      UC Treatments / Results  Labs (all labs ordered are listed, but only abnormal results are displayed) Labs Reviewed  GROUP A STREP BY PCR - Abnormal; Notable for the following components:      Result Value   Group A Strep by PCR DETECTED (*)    All other components within normal limits    EKG   Radiology No results found.  Procedures Procedures (including critical care time)  Medications Ordered in UC Medications - No data to display  Initial Impression / Assessment and Plan / UC Course  I have reviewed the triage vital signs and the nursing notes.  Pertinent labs & imaging results that were available during my care of the patient were reviewed by me and considered in my medical decision making (see chart for details).  Strep pharyngitis  Vitals are stable and patient is in no signs of distress or toxic appearing, significant tonsillar adenopathy with erythema present on exam, strep testing positive, Decadron 8 injection given in office due to adenopathy, prescribed cefdinir as Augmentin completed within the week, additionally prescribed Magic mouthwash and prednisone course for additional comfort and support, advised increase fluid intake until able to tolerate foods, may follow-up with his urgent care as needed, if symptoms continue to persist, discussed with patient that ear nose throat will be needed for follow-up Final Clinical Impressions(s) / UC Diagnoses   Final diagnoses:  Strep pharyngitis     Discharge Instructions      Your strep test today was positive  Take cefdinir twice a day for the next 10 days  You may gargle and spit lidocaine solution every 4 hours as needed for temporary relief of your sore throat  May use ibuprofen every 8 hours as needed in addition to Tylenol for additional comfort  You may  follow-up at urgent care as needed     ED Prescriptions   None    PDMP not reviewed this encounter.   Hans Eden, NP 07/09/22 1057

## 2022-07-09 NOTE — Discharge Instructions (Addendum)
Your strep test today was positive  Take cefdinir twice a day for the next 10 days  Due to the size of your tonsils you have been given an injection of steroids today here in the office, starting tomorrow take oral prednisone tablets every morning for 5 days, dose was increased from which you  took last time  You may gargle and spit magic mouthwash solution every 4 hours as needed for temporary relief of your sore throat  May use ibuprofen every 8 hours as needed in addition to Tylenol for additional comfort  You may follow-up at urgent care as needed

## 2023-05-06 ENCOUNTER — Ambulatory Visit
Admission: EM | Admit: 2023-05-06 | Discharge: 2023-05-06 | Disposition: A | Discharge status: Home / Self Care | Payer: BLUE CROSS/BLUE SHIELD | Attending: Family Medicine | Admitting: Family Medicine

## 2023-05-06 ENCOUNTER — Encounter: Payer: Self-pay | Admitting: Emergency Medicine

## 2023-05-06 DIAGNOSIS — J101 Influenza due to other identified influenza virus with other respiratory manifestations: Secondary | ICD-10-CM | POA: Diagnosis present

## 2023-05-06 DIAGNOSIS — J020 Streptococcal pharyngitis: Secondary | ICD-10-CM | POA: Diagnosis present

## 2023-05-06 LAB — RESP PANEL BY RT-PCR (FLU A&B, COVID) ARPGX2
Influenza A by PCR: POSITIVE — AB
Influenza B by PCR: NEGATIVE
SARS Coronavirus 2 by RT PCR: NEGATIVE

## 2023-05-06 LAB — GROUP A STREP BY PCR: Group A Strep by PCR: DETECTED — AB

## 2023-05-06 MED ORDER — AMOXICILLIN 500 MG PO CAPS: 1000.0000 mg | ORAL_CAPSULE | Freq: Every day | ORAL | 0 refills | Status: AC

## 2023-05-06 MED ORDER — OSELTAMIVIR PHOSPHATE 75 MG PO CAPS: 75.0000 mg | ORAL_CAPSULE | Freq: Two times a day (BID) | ORAL | 0 refills | Status: AC

## 2023-05-06 MED ORDER — PROMETHAZINE-DM 6.25-15 MG/5ML PO SYRP: 5.0000 mL | ORAL_SOLUTION | Freq: Four times a day (QID) | ORAL | 0 refills | Status: AC | PRN

## 2023-05-06 NOTE — ED Triage Notes (Signed)
Patient c/o cough, chills, bodyaches, and headaches and fever that started yesterday.

## 2023-05-06 NOTE — ED Provider Notes (Signed)
[facility]    CSN: [id number] Arrival date & time: 05/06/23  1442      History   Chief Complaint Chief Complaint  Patient presents with   Cough   Fever    HPI Cassandra Pena is a 43 y.o. female.   HPI  History obtained from the patient. Cassandra Pena presents for chills, sweats, fever, body aches, headache, cough, nasal congestion, rhinorrhea, diarrhea and sore throat that started yesterday.  She and her daughter traveled back from [place] to move back to [place] yesterday.  She states she was vomiting in the car.  Of note, her daughter has similar symptoms.  Has been taking over-the-counter medications with no relief.  Has been taking Tylenol and Motrin around-the-clock.        Past Medical History:  Diagnosis Date   Anxiety    Depression    Endometriosis    Migraines    PTSD (post-traumatic stress disorder)     Patient Active Problem List   Diagnosis Date Noted   MDD (major depressive disorder), recurrent episode, moderate ([facility]) 05/14/2022   Substance abuse ([facility]) 05/14/2022   Depression 05/14/2022   Acute posttraumatic stress disorder 05/14/2022   Opiate abuse, episodic ([facility]) 05/14/2022   Benzodiazepine abuse ([facility]) 05/14/2022    Past Surgical History:  Procedure Laterality Date   CHOLECYSTECTOMY     LEEP     TUBAL LIGATION      OB History   No obstetric history on file.      Home Medications    Prior to Admission medications   Medication Sig Start Date End Date Taking? Authorizing Provider  amoxicillin (AMOXIL) 500 MG capsule Take 2 capsules (1,000 mg total) by mouth daily for 10 days. 05/06/23 05/16/23 Yes Cassandra Pena, Cassandra Meth, DO  oseltamivir (TAMIFLU) 75 MG capsule Take 1 capsule (75 mg total) by mouth every 12 (twelve) hours. 05/06/23  Yes Cassandra Wery, DO  promethazine-dextromethorphan (PROMETHAZINE-DM) 6.25-15 MG/5ML syrup Take 5 mLs by mouth 4 (four) times daily as needed. 05/06/23  Yes Cassandra Buel, DO  hydrOXYzine  (ATARAX) 50 MG tablet Take 1 tablet (50 mg total) by mouth every 6 (six) hours as needed for anxiety. 05/16/22   Clapacs, Jackquline Denmark, MD  sertraline (ZOLOFT) 100 MG tablet Take 1 tablet (100 mg total) by mouth daily. 05/17/22   Clapacs, Jackquline Denmark, MD  SUMAtriptan (IMITREX) 50 MG tablet Take 1 tablet (50 mg total) by mouth once for 1 dose. May repeat in 2 hours if headache persists or recurs. 10/10/19 10/07/20  Tommie Sams, DO  fluticasone (FLONASE) 50 MCG/ACT nasal spray Place 2 sprays into both nostrils daily. 06/25/18 12/01/18  Domenick Gong, MD    Family History Family History  Problem Relation Age of Onset   Breast cancer Paternal Aunt 39   Breast cancer Paternal Grandmother    Hypertension Mother    Other Father        unknown medical history    Social History Social History   Tobacco Use   Smoking status: Every Day    Current packs/day: 1.00    Types: Cigarettes    Passive exposure: Current   Smokeless tobacco: Never  Vaping Use   Vaping status: Never Used  Substance Use Topics   Alcohol use: No    Comment: Last ETOH use 03/2022.   Drug use: Not Currently    Types: Marijuana    Comment: Last marijuana use 10/2021.     Allergies   Bee venom, Hydrocodone-acetaminophen, and Vicodin [  hydrocodone-acetaminophen]   Review of Systems Review of Systems: negative unless otherwise stated in HPI.      Physical Exam Triage Vital Signs ED Triage Vitals  Encounter Vitals Group     BP      Systolic BP Percentile      Diastolic BP Percentile      Pulse      Resp      Temp      Temp src      SpO2      Weight      Height      Head Circumference      Peak Flow      Pain Score      Pain Loc      Pain Education      Exclude from Growth Chart    No data found.  Updated Vital Signs BP 122/86 (BP Location: Right Arm)   Pulse 86   Temp 98.1 F (36.7 C) (Oral)   Resp 16   Ht 5\' 8"  (1.727 m)   Wt 93.4 kg   LMP 05/02/2023 (Exact Date)   SpO2 95%   BMI 31.31 kg/m    Visual Acuity Right Eye Distance:   Left Eye Distance:   Bilateral Distance:    Right Eye Near:   Left Eye Near:    Bilateral Near:     Physical Exam GEN:     alert, ill non-toxic appearing female in no distress    HENT:  mucus membranes moist, oropharyngeal without lesions, moderate erythema, no tonsillar hypertrophy or exudates, clear nasal discharge EYES:   no scleral injection or discharge NECK:  normal ROM, +lymphadenopathy, no meningismus   RESP:  no increased work of breathing, clear to auscultation bilaterally CVS:   regular rate and rhythm ABD:    Soft, nontender, nondistended, no guarding, no rebound, no palpable masses Skin:   warm and dry, no rash on visible skin    UC Treatments / Results  Labs (all labs ordered are listed, but only abnormal results are displayed) Labs Reviewed  GROUP A STREP BY PCR - Abnormal; Notable for the following components:      Result Value   Group A Strep by PCR DETECTED (*)    All other components within normal limits  RESP PANEL BY RT-PCR (FLU A&B, COVID) ARPGX2 - Abnormal; Notable for the following components:   Influenza A by PCR POSITIVE (*)    All other components within normal limits    EKG   Radiology No results found.  Procedures Procedures (including critical care time)  Medications Ordered in UC Medications - No data to display  Initial Impression / Assessment and Plan / UC Course  I have reviewed the triage vital signs and the nursing notes.  Pertinent labs & imaging results that were available during my care of the patient were reviewed by me and considered in my medical decision making (see chart for details).       Pt is a 43 y.o. female who presents for 2 days of flulike symptoms. Cassandra Pena is afebrile here though had recent antipyretics. Satting 95% on room air. Overall pt is ill but non-toxic appearing, well hydrated, without respiratory distress. Pulmonary exam is unremarkable except for frequent cough.   Strep PCR is positive.  Treat with amoxicillin 1000 mg daily.  COVID and influenza panel obtained and she is influenza A positive.  Discussed risk and benefits of Tamiflu and she would like to proceed with prescription.  Tamiflu  sent to her pharmacy.  Discussed symptomatic treatment.  Typical duration of symptoms discussed.  Promethazine DM for cough.  Return and ED precautions given and voiced understanding. Discussed MDM, treatment plan and plan for follow-up with patient who agrees with plan.     Final Clinical Impressions(s) / UC Diagnoses   Final diagnoses:  Strep pharyngitis  Influenza A     Discharge Instructions      You have strep pharyngitis as well as influenza A.  Stop at the pharmacy to pick up your prescriptions.  Overall, you should gradually get better over the next 7 to 10 days.  The cough after the flu may last up to 3 weeks.      ED Prescriptions     Medication Sig Dispense Auth. Provider   amoxicillin (AMOXIL) 500 MG capsule Take 2 capsules (1,000 mg total) by mouth daily for 10 days. 20 capsule Cassandra Karnes, DO   oseltamivir (TAMIFLU) 75 MG capsule Take 1 capsule (75 mg total) by mouth every 12 (twelve) hours. 10 capsule Cassandra Nielson, DO   promethazine-dextromethorphan (PROMETHAZINE-DM) 6.25-15 MG/5ML syrup Take 5 mLs by mouth 4 (four) times daily as needed. 118 mL Cassandra Cabal, DO      PDMP not reviewed this encounter.   Cassandra Cabal, DO 05/06/23 1937

## 2023-05-06 NOTE — Discharge Instructions (Signed)
You have strep pharyngitis as well as influenza A.  Stop at the pharmacy to pick up your prescriptions.  Overall, you should gradually get better over the next 7 to 10 days.  The cough after the flu may last up to 3 weeks.

## 2023-12-13 ENCOUNTER — Ambulatory Visit
Admission: EM | Admit: 2023-12-13 | Discharge: 2023-12-13 | Disposition: A | Discharge status: Home / Self Care | Payer: Self-pay | Attending: Emergency Medicine | Admitting: Emergency Medicine

## 2023-12-13 ENCOUNTER — Ambulatory Visit (INDEPENDENT_AMBULATORY_CARE_PROVIDER_SITE_OTHER): Payer: Self-pay

## 2023-12-13 DIAGNOSIS — S93401A Sprain of unspecified ligament of right ankle, initial encounter: Secondary | ICD-10-CM

## 2023-12-13 DIAGNOSIS — M25571 Pain in right ankle and joints of right foot: Secondary | ICD-10-CM
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
## 2023-12-13 NOTE — ED Triage Notes (Signed)
 Patient states that she has an old injury in her right foot. States that the foot is tender and when she went to stand up from getting up from the couch today she was trying to distribute weight evenly on both feet and heard a pop in the right foot that sent a pain up to her thigh. Right foot is swollen.

## 2023-12-13 NOTE — Discharge Instructions (Addendum)
 Wet read of ankle does not appear to have any fracture, hardware intact.  Rest, ice,ace wrap, elevate, use walker, follow-up with your orthopedist-call for appointment. May take ibuprofen  or aleve (not both) as well may take tylenol  as label directed.

## 2023-12-14 ENCOUNTER — Ambulatory Visit (HOSPITAL_COMMUNITY): Payer: Self-pay
